# Patient Record
Sex: Female | Born: 1950 | Race: Black or African American | Hispanic: No | Marital: Married | Smoking: Former smoker
Health system: Southern US, Community
[De-identification: ages and names within clinical notes are randomized; demographics above are authoritative.]

## PROBLEM LIST (undated history)

## (undated) DIAGNOSIS — J449 Chronic obstructive pulmonary disease, unspecified: Secondary | ICD-10-CM

## (undated) DIAGNOSIS — I509 Heart failure, unspecified: Secondary | ICD-10-CM

## (undated) DIAGNOSIS — I10 Essential (primary) hypertension: Secondary | ICD-10-CM

## (undated) DIAGNOSIS — I2510 Atherosclerotic heart disease of native coronary artery without angina pectoris: Secondary | ICD-10-CM

## (undated) DIAGNOSIS — J45909 Unspecified asthma, uncomplicated: Secondary | ICD-10-CM | POA: Insufficient documentation

## (undated) HISTORY — PX: CARDIAC DEFIBRILLATOR PLACEMENT: SHX171

## (undated) HISTORY — DX: Heart failure, unspecified: I50.9

---

## 2009-07-06 DIAGNOSIS — G4733 Obstructive sleep apnea (adult) (pediatric): Secondary | ICD-10-CM | POA: Insufficient documentation

## 2009-07-06 DIAGNOSIS — F319 Bipolar disorder, unspecified: Secondary | ICD-10-CM | POA: Insufficient documentation

## 2009-07-06 DIAGNOSIS — K219 Gastro-esophageal reflux disease without esophagitis: Secondary | ICD-10-CM | POA: Insufficient documentation

## 2013-06-18 DIAGNOSIS — I509 Heart failure, unspecified: Secondary | ICD-10-CM | POA: Insufficient documentation

## 2013-06-18 DIAGNOSIS — I428 Other cardiomyopathies: Secondary | ICD-10-CM | POA: Insufficient documentation

## 2013-12-12 DIAGNOSIS — J449 Chronic obstructive pulmonary disease, unspecified: Secondary | ICD-10-CM | POA: Insufficient documentation

## 2015-03-25 DIAGNOSIS — F419 Anxiety disorder, unspecified: Secondary | ICD-10-CM | POA: Insufficient documentation

## 2015-03-25 DIAGNOSIS — I10 Essential (primary) hypertension: Secondary | ICD-10-CM | POA: Insufficient documentation

## 2015-03-25 DIAGNOSIS — F411 Generalized anxiety disorder: Secondary | ICD-10-CM | POA: Insufficient documentation

## 2015-03-25 DIAGNOSIS — R0609 Other forms of dyspnea: Secondary | ICD-10-CM

## 2015-04-05 ENCOUNTER — Institutional Professional Consult (permissible substitution): Payer: Medicare Other | Admitting: Internal Medicine

## 2015-11-17 DIAGNOSIS — E876 Hypokalemia: Secondary | ICD-10-CM | POA: Insufficient documentation

## 2016-02-17 ENCOUNTER — Ambulatory Visit
Admission: RE | Admit: 2016-02-17 | Discharge: 2016-02-17 | Disposition: A | Discharge status: Home / Self Care | Payer: Medicare HMO | Source: Ambulatory Visit | Attending: Family Medicine | Admitting: Family Medicine

## 2016-02-17 ENCOUNTER — Other Ambulatory Visit: Payer: Self-pay | Admitting: Family Medicine

## 2016-02-17 DIAGNOSIS — M545 Low back pain: Secondary | ICD-10-CM

## 2016-02-17 DIAGNOSIS — M5136 Other intervertebral disc degeneration, lumbar region: Secondary | ICD-10-CM | POA: Diagnosis not present

## 2016-02-17 DIAGNOSIS — M5137 Other intervertebral disc degeneration, lumbosacral region: Secondary | ICD-10-CM | POA: Insufficient documentation

## 2016-03-09 DIAGNOSIS — R109 Unspecified abdominal pain: Secondary | ICD-10-CM | POA: Insufficient documentation

## 2016-03-09 DIAGNOSIS — R1900 Intra-abdominal and pelvic swelling, mass and lump, unspecified site: Secondary | ICD-10-CM | POA: Insufficient documentation

## 2016-06-23 DIAGNOSIS — Z9889 Other specified postprocedural states: Secondary | ICD-10-CM | POA: Insufficient documentation

## 2016-06-23 DIAGNOSIS — Z95810 Presence of automatic (implantable) cardiac defibrillator: Secondary | ICD-10-CM | POA: Insufficient documentation

## 2017-05-30 ENCOUNTER — Ambulatory Visit
Admission: RE | Admit: 2017-05-30 | Discharge: 2017-05-30 | Disposition: A | Discharge status: Home / Self Care | Payer: Medicare HMO | Source: Ambulatory Visit | Attending: Family Medicine | Admitting: Family Medicine

## 2017-05-30 ENCOUNTER — Other Ambulatory Visit: Payer: Self-pay | Admitting: Family Medicine

## 2017-05-30 DIAGNOSIS — J449 Chronic obstructive pulmonary disease, unspecified: Secondary | ICD-10-CM

## 2017-05-30 DIAGNOSIS — I509 Heart failure, unspecified: Secondary | ICD-10-CM | POA: Diagnosis not present

## 2017-05-30 DIAGNOSIS — M79671 Pain in right foot: Secondary | ICD-10-CM

## 2017-07-18 ENCOUNTER — Encounter: Payer: Self-pay | Admitting: Emergency Medicine

## 2017-07-18 ENCOUNTER — Emergency Department: Payer: Medicare HMO

## 2017-07-18 ENCOUNTER — Emergency Department
Admission: EM | Admit: 2017-07-18 | Discharge: 2017-07-18 | Disposition: A | Discharge status: Home / Self Care | Payer: Medicare HMO | Attending: Emergency Medicine | Admitting: Emergency Medicine

## 2017-07-18 DIAGNOSIS — S92355A Nondisplaced fracture of fifth metatarsal bone, left foot, initial encounter for closed fracture: Secondary | ICD-10-CM | POA: Diagnosis not present

## 2017-07-18 DIAGNOSIS — Y929 Unspecified place or not applicable: Secondary | ICD-10-CM | POA: Insufficient documentation

## 2017-07-18 DIAGNOSIS — Z95810 Presence of automatic (implantable) cardiac defibrillator: Secondary | ICD-10-CM | POA: Diagnosis not present

## 2017-07-18 DIAGNOSIS — J449 Chronic obstructive pulmonary disease, unspecified: Secondary | ICD-10-CM | POA: Insufficient documentation

## 2017-07-18 DIAGNOSIS — Y9301 Activity, walking, marching and hiking: Secondary | ICD-10-CM | POA: Insufficient documentation

## 2017-07-18 DIAGNOSIS — Y999 Unspecified external cause status: Secondary | ICD-10-CM | POA: Diagnosis not present

## 2017-07-18 DIAGNOSIS — I10 Essential (primary) hypertension: Secondary | ICD-10-CM | POA: Insufficient documentation

## 2017-07-18 DIAGNOSIS — I2510 Atherosclerotic heart disease of native coronary artery without angina pectoris: Secondary | ICD-10-CM | POA: Insufficient documentation

## 2017-07-18 DIAGNOSIS — W19XXXA Unspecified fall, initial encounter: Secondary | ICD-10-CM | POA: Insufficient documentation

## 2017-07-18 DIAGNOSIS — S99922A Unspecified injury of left foot, initial encounter: Secondary | ICD-10-CM | POA: Diagnosis present

## 2017-07-18 HISTORY — DX: Chronic obstructive pulmonary disease, unspecified: J44.9

## 2017-07-18 HISTORY — DX: Atherosclerotic heart disease of native coronary artery without angina pectoris: I25.10

## 2017-07-18 HISTORY — DX: Essential (primary) hypertension: I10

## 2017-07-18 MED ORDER — HYDROCODONE-ACETAMINOPHEN 5-325 MG PO TABS: 1.0000 | ORAL_TABLET | Freq: Four times a day (QID) | ORAL | 0 refills | Status: DC | PRN

## 2017-07-18 NOTE — ED Triage Notes (Signed)
Pt to ED via POV with c/o LFT foot pain after mechanical fall this am. Pt denies any other injures. VS stable

## 2017-07-18 NOTE — Discharge Instructions (Signed)
Ice and elevation. Wear the wooden shoe until able to see a podiatrist. Follow-up with Dr. Ether Griffins. You will need to call and make an appointment. Take Vicodin as needed for pain every 6 hours. This medication could cause drowsiness.

## 2017-07-18 NOTE — ED Provider Notes (Signed)
[facility] Emergency Department Provider Note   ____________________________________________   First MD Initiated Contact with Patient 07/18/17 1229     (approximate)  I have reviewed the triage vital signs and the nursing notes.   HISTORY  Chief Complaint Foot Injury   HPI Penny Munoz is a 66 y.o. female she is complaining of left foot pain. Patient states that she had a mechanical fall this morning. She denies any head injury or loss of consciousness and was able to drive her husband to the cancer Center. She is continued to have increased pain this morning. She denies any previous problems with her foot.she rates her pain as a 10 over 10 and increases with walking.   Past Medical History:  Diagnosis Date  . COPD (chronic obstructive pulmonary disease) (HCC)   . Coronary artery disease   . Hypertension     There are no active problems to display for this patient.   Past Surgical History:  Procedure Laterality Date  . CARDIAC DEFIBRILLATOR PLACEMENT      Prior to Admission medications   Medication Sig Start Date End Date Taking? Authorizing Provider  HYDROcodone-acetaminophen (NORCO/VICODIN) 5-325 MG tablet Take 1 tablet by mouth every 6 (six) hours as needed. 07/18/17   Tommi Rumps, PA-C    Allergies Penicillins  No family history on file.  Social History Social History   Tobacco Use  . Smoking status: Never Smoker  . Smokeless tobacco: Never Used  Substance Use Topics  . Alcohol use: No    Frequency: Never  . Drug use: No    Review of Systems Constitutional: No fever/chills Cardiovascular: Denies chest pain. Respiratory: Denies shortness of breath. Musculoskeletal: positive left foot pain. Skin: Negative for rash. Neurological: Negative for focal weakness or numbness. ___________________________________________   PHYSICAL EXAM:  VITAL SIGNS: ED Triage Vitals  Enc Vitals Group     BP 07/18/17 1155  137/81     Pulse Rate 07/18/17 1155 70     Resp 07/18/17 1155 18     Temp 07/18/17 1155 98.3 F (36.8 C)     Temp Source 07/18/17 1155 Oral     SpO2 07/18/17 1155 99 %     Weight 07/18/17 1155 300 lb (136.1 kg)     Height 07/18/17 1155 5\' 8"  (1.727 m)     Head Circumference --      Peak Flow --      Pain Score 07/18/17 1154 10     Pain Loc --      Pain Edu? --      Excl. in GC? --    Constitutional: Alert and oriented. Well appearing and in no acute distress. Eyes: Conjunctivae are normal.  Head: Atraumatic. Neck: No stridor.   Cardiovascular: Normal rate, regular rhythm. Grossly normal heart sounds.  Good peripheral circulation. Respiratory: Normal respiratory effort.  No retractions. Lungs CTAB. Musculoskeletal: on examination of left that there is moderate tenderness on palpation of the lateral aspect metatarsal area. No gross deformity is noted. There is some soft tissue swelling present. Skin is intact. Neurologic:  Normal speech and language. No gross focal neurologic deficits are appreciated.  Skin:  Skin is warm, dry and intact. No rash noted. Psychiatric: Mood and affect are normal. Speech and behavior are normal.  ____________________________________________   LABS (all labs ordered are listed, but only abnormal results are displayed)  Labs Reviewed - No data to display  RADIOLOGY  Dg Foot Complete Left  Result Date:  07/18/2017 CLINICAL DATA:  Recent fall with lateral foot pain, initial encounter EXAM: LEFT FOOT - COMPLETE 3+ VIEW COMPARISON:  None. FINDINGS: Undisplaced fracture through the base of the fifth metatarsal is noted. No other fractures are seen. Mild soft tissue swelling is seen. IMPRESSION: Undisplaced fifth metatarsal fracture at the base. Electronically Signed   By: Alcide CleverMark  Lukens M.D.   On: 07/18/2017 12:25    ____________________________________________   PROCEDURES  Procedure(s) performed: None  Procedures  Critical Care performed:  No  ____________________________________________   INITIAL IMPRESSION / ASSESSMENT AND PLAN / ED COURSE  Patient was made aware that she has a fracture of her fifth metatarsal. She is to, compartment with Dr. Ether GriffinsFowler. At this time she was placed in an Ace wrap and postop shoe. She states that she has taken Vicodin in the past and did not have any difficulty taking it. She was warned that this medication could cause drowsiness and increase her risk for falling.  ____________________________________________   FINAL CLINICAL IMPRESSION(S) / ED DIAGNOSES  Final diagnoses:  Closed nondisplaced fracture of fifth metatarsal bone of left foot, initial encounter     ED Discharge Orders        Ordered    HYDROcodone-acetaminophen (NORCO/VICODIN) 5-325 MG tablet  Every 6 hours PRN     07/18/17 1251       Note:  This document was prepared using Dragon voice recognition software and may include unintentional dictation errors.    Tommi RumpsSummers, Cavan Bearden L, PA-C 07/18/17 1304    Sharman CheekStafford, Phillip, MD 07/18/17 [phone]

## 2018-01-16 ENCOUNTER — Ambulatory Visit: Payer: Self-pay | Admitting: Surgery

## 2018-04-18 ENCOUNTER — Encounter: Payer: Self-pay | Admitting: *Deleted

## 2018-06-13 ENCOUNTER — Other Ambulatory Visit: Payer: Self-pay | Admitting: Family Medicine

## 2018-06-13 DIAGNOSIS — Z1231 Encounter for screening mammogram for malignant neoplasm of breast: Secondary | ICD-10-CM

## 2018-07-15 ENCOUNTER — Other Ambulatory Visit: Payer: Self-pay | Admitting: Family Medicine

## 2018-07-15 DIAGNOSIS — Z13820 Encounter for screening for osteoporosis: Secondary | ICD-10-CM

## 2018-10-02 ENCOUNTER — Other Ambulatory Visit: Payer: Self-pay | Admitting: Family Medicine

## 2018-10-02 ENCOUNTER — Other Ambulatory Visit: Payer: Self-pay | Admitting: Internal Medicine

## 2018-10-02 DIAGNOSIS — Z1231 Encounter for screening mammogram for malignant neoplasm of breast: Secondary | ICD-10-CM

## 2018-12-31 ENCOUNTER — Encounter: Payer: Self-pay | Admitting: *Deleted

## 2019-01-29 ENCOUNTER — Ambulatory Visit (INDEPENDENT_AMBULATORY_CARE_PROVIDER_SITE_OTHER): Payer: Medicare HMO | Admitting: Gastroenterology

## 2019-01-29 ENCOUNTER — Encounter: Payer: Self-pay | Admitting: Family Medicine

## 2019-01-29 DIAGNOSIS — R195 Other fecal abnormalities: Secondary | ICD-10-CM

## 2019-01-29 NOTE — Progress Notes (Signed)
[facility] [address]  [address]  [place], [place] [address]  Main: [phone]  Fax: [phone]   Gastroenterology Consultation  Referring Provider:     Leanna Sato, MD Primary Care Physician:  Leanna Sato, MD Reason for Consultation:    Blood in stool        HPI:   Virtual Visit via Video Note  I connected with patient on 01/29/19 at  9:00 AM EDT by video ([email or web address]) and verified that I am speaking with the correct person using two identifiers.   I discussed the limitations, risks, security and privacy concerns of performing an evaluation and management service by video and the availability of in person appointments. I also discussed with the patient that there may be a patient responsible charge related to this service. The patient expressed understanding and agreed to proceed.  Location of the patient: Home Location of provider: Home Participating persons: Patient and provider only (Nursing staff checked in patient via phone but were not physically involved in the video interaction - see their notes)   History of Present Illness: Chief Complaint  Patient presents with  . New Patient (Initial Visit)  . Blood In Stools    Penny Munoz is a 68 y.o. y/o female referred for consultation & management  by Dr. Marvis Moeller, Doralee Albino, MD.  Patient visited her PCP due to blood in stool.  Patient states right before the episode occurred she had started eating cayenne pepper every day, along with ginger root and also started taking a fiber supplement which led to multiple loose bowel movements and noted small streaks of blood in the stool when this occurred.  She denies any black stool with these episodes.  After she stopped taking all the above supplements and her stool started to normalize, she did not see any further blood in stool.  Now her bowel movements are brown in color.  Reports previous history of hemorrhoids and has used Preparation H intermittently,  and did use it at the time that her bright red blood episode occurred in May 2020 and this resolved the symptoms.  No prior colonoscopy.  No family history of colon cancer.  PCP ordered FOBT test which was positive. PCP note specifically states patient is not a candidate for colonoscopy due to her cardiomyopathy.  Past Medical History:  Diagnosis Date  . COPD (chronic obstructive pulmonary disease) (HCC)   . Coronary artery disease   . Hypertension     Past Surgical History:  Procedure Laterality Date  . CARDIAC DEFIBRILLATOR PLACEMENT      Prior to Admission medications   Medication Sig Start Date End Date Taking? Authorizing Provider  acetaminophen (TYLENOL) 325 MG tablet Take by mouth. 03/09/16  Yes [provider]  albuterol (PROVENTIL HFA) 108 (90 Base) MCG/ACT inhaler Inhale into the lungs.   Yes [provider]  carvedilol (COREG) 12.5 MG tablet Take 12.5 mg by mouth 2 (two) times daily with a meal.   Yes [provider]  cetirizine (ZYRTEC) 10 MG tablet Take 10 mg by mouth daily.   Yes [provider]  furosemide (LASIX) 40 MG tablet Take 40 mg by mouth 2 (two) times daily.   Yes [provider]  Garlic (GNP GARLIC EXTRACT) 294 MG TABS KYOLIC AGED GARLIC EXTRACT 600MG  03/25/15  Yes [provider]  HYDROcodone-acetaminophen (NORCO/VICODIN) 5-325 MG tablet Take 1 tablet by mouth every 6 (six) hours as needed. 07/18/17  Yes Tommi Rumps,  PA-C  omeprazole (PRILOSEC) 20 MG capsule Take 20 mg by mouth daily.   Yes [provider]  Potassium Chloride CR (MICRO-K) 8 MEQ CPCR capsule CR Take 8 mEq by mouth daily.   Yes [provider]  quinapril (ACCUPRIL) 20 MG tablet Take 20 mg by mouth at bedtime.   Yes [provider]  Zinc 10 MG LOZG Take by mouth.   Yes [provider]    No family history on file.   Social History   Tobacco Use  . Smoking status: Never Smoker  . Smokeless  tobacco: Never Used  Substance Use Topics  . Alcohol use: No    Frequency: Never  . Drug use: No    Allergies as of 01/29/2019 - Review Complete 01/29/2019  Allergen Reaction Noted  . Hydrocodone Other (See Comments) 03/29/2016  . Oxycodone hcl Shortness Of Breath 03/17/2016  . Ampicillin  06/23/2014  . Amoxicillin Rash 11/03/2014  . Penicillins Anxiety 07/18/2017    Review of Systems:    All systems reviewed and negative except where noted in HPI.   Observations/Objective:  Labs: 2019 labs reviewed Imaging Studies: No results found.  Assessment and Plan:   Penny Munoz is a 68 y.o. y/o female has been referred for blood in stool  Assessment and Plan: The episode of blood in stool occurred in May 2020 when patient took fiber supplements, and multiple days of cayenne pepper with ginger root extract leading to loose bowel movements for days.  Symptoms most consistent with likely underlying hemorrhoids as this blood in stool resolved after her multiple loose bowel movements resolved and after she stopped taking the above fiber supplements.  She denies any black stool or melena  However, she has never had a colonoscopy and FOBT being positive is an indication for colonoscopy.   We extensively discussed this with the patient and described that a colonoscopy would be needed to rule out colon cancer and any other underlying lesions such as large polyps which can possibly be removed at this time.  I discussed that we would discuss the risks of the procedure with her cardiologist to see if they think it would be safe to proceed prior to scheduling the procedure.  However, patient refuses a full colonoscopy and understands the risk of underlying malignancy and states, "whatever god wishes for me".  We therefore discussed the alternatives of CT colonoscopy with the limitation that if it shows an underlying lesion she will need further evaluation with full colonoscopy and it  may miss small lesions.  She understands these risks and would rather proceed with a CT colonoscopy at this time.  I will also obtain a hemoglobin to ensure it is normal given her previous episodes of blood per rectum  I have asked her to avoid the fiber supplements that caused diarrhea  Follow Up Instructions: Follow-up in 4 to 6 weeks  I discussed the assessment and treatment plan with the patient. The patient was provided an opportunity to ask questions and all were answered. The patient agreed with the plan and demonstrated an understanding of the instructions.   The patient was advised to call back or seek an in-person evaluation if the symptoms worsen or if the condition fails to improve as anticipated.  I provided 30 minutes of face-to-face time via video software during this encounter.  Additional time was spent in reviewing patient's chart, placing orders etc.   Virgel Manifold, MD  Speech recognition software was used to  dictate the above note.

## 2019-01-30 ENCOUNTER — Other Ambulatory Visit: Payer: Self-pay

## 2019-01-30 DIAGNOSIS — R195 Other fecal abnormalities: Secondary | ICD-10-CM

## 2019-01-30 DIAGNOSIS — Z1211 Encounter for screening for malignant neoplasm of colon: Secondary | ICD-10-CM

## 2019-02-06 ENCOUNTER — Ambulatory Visit: Payer: Medicare HMO | Admitting: Gastroenterology

## 2019-03-06 ENCOUNTER — Ambulatory Visit: Payer: Medicare HMO | Admitting: Gastroenterology

## 2019-03-18 ENCOUNTER — Other Ambulatory Visit: Payer: Self-pay

## 2019-03-18 DIAGNOSIS — R195 Other fecal abnormalities: Secondary | ICD-10-CM

## 2019-04-01 ENCOUNTER — Telehealth: Payer: Self-pay | Admitting: Gastroenterology

## 2019-04-01 NOTE — Telephone Encounter (Signed)
Patient called stating we were trying to schedule her a scan in [facility] & she does not want to go there. She would like to have the scan at [facility]. Please call & l/m she does not answer but will return call.

## 2019-04-03 NOTE — Telephone Encounter (Signed)
Tiffany from [facility] [facility] is calling regarding pt CT scan she statets pt would like have it it scheduled at [facility] not [facility] any questions you can call  cb [phone]

## 2019-04-04 NOTE — Telephone Encounter (Signed)
Spoke with pt and advised her [facility] is the only location that offers a CT colonoscopy. Pt stated that is too far for her to drive. Her husband has had many strokes and she is scared to leave him. I advised her I would let you know her situation and discuss any other options available, if any.

## 2019-04-24 ENCOUNTER — Telehealth: Payer: Self-pay

## 2019-04-24 NOTE — Telephone Encounter (Signed)
Patient states she wants to scheduled her CT Virtual colonoscopy Diagnostic for 05/12/2019 because she has someone to stay with her husband that day.  Called the [facility] on [address]  There number is [phone] They state we need to fax the information to them and then they would contact the patient to set up the appointment. Faxed the order to them at [phone] and informed patient they would be contacting her to set up appointment.

## 2019-04-25 NOTE — Telephone Encounter (Signed)
Called to check if they received the order. They stated they did and they would do it for her on 05/12/2019 and would call her today to scheduled and go over the directions.

## 2019-04-25 NOTE — Telephone Encounter (Signed)
Patient is scheduled for 05/12/19

## 2019-05-12 ENCOUNTER — Ambulatory Visit
Admission: RE | Admit: 2019-05-12 | Discharge: 2019-05-12 | Disposition: A | Discharge status: Home / Self Care | Payer: Medicare HMO | Source: Ambulatory Visit | Attending: Gastroenterology | Admitting: Gastroenterology

## 2019-05-12 DIAGNOSIS — R195 Other fecal abnormalities: Secondary | ICD-10-CM

## 2019-05-15 ENCOUNTER — Telehealth: Payer: Self-pay

## 2019-05-15 NOTE — Telephone Encounter (Signed)
Patient is calling because patient states she can not wait till 06/04/19 when she has a appointment with Dr. Bonna Gains. Patient had a CT virtual colonoscopy done on 05/12/2019 and wants to know the results. Please advised

## 2019-05-15 NOTE — Telephone Encounter (Signed)
There was no evidence of polyp or cancer based on the CT scan results.  She has diverticulosis of the colon, predominantly in the sigmoid area  Scattered colonic diverticulosis. Moderate in the sigmoid colon. Areas of under distention in the sigmoid colon and right colon. No visible fixed polypoid filling defects or annular constricting Lesions.  Sherri Sear, MD

## 2019-05-15 NOTE — Telephone Encounter (Signed)
Patient verbalized understanding and will follow up on 06/04/19

## 2019-06-04 ENCOUNTER — Ambulatory Visit: Payer: Medicare HMO | Admitting: Gastroenterology

## 2019-06-04 ENCOUNTER — Encounter: Payer: Self-pay | Admitting: Gastroenterology

## 2020-06-14 ENCOUNTER — Encounter (INDEPENDENT_AMBULATORY_CARE_PROVIDER_SITE_OTHER): Payer: Self-pay

## 2020-06-14 ENCOUNTER — Inpatient Hospital Stay: Payer: Medicare HMO | Attending: Oncology | Admitting: Oncology

## 2020-06-14 ENCOUNTER — Inpatient Hospital Stay: Payer: Medicare HMO

## 2020-06-14 ENCOUNTER — Other Ambulatory Visit: Payer: Self-pay

## 2020-06-14 ENCOUNTER — Encounter: Payer: Self-pay | Admitting: Oncology

## 2020-06-14 VITALS — BP 116/85 | HR 72 | Temp 97.0°F | Resp 20 | Wt 281.8 lb

## 2020-06-14 DIAGNOSIS — I110 Hypertensive heart disease with heart failure: Secondary | ICD-10-CM | POA: Diagnosis not present

## 2020-06-14 DIAGNOSIS — H9202 Otalgia, left ear: Secondary | ICD-10-CM | POA: Diagnosis not present

## 2020-06-14 DIAGNOSIS — Z885 Allergy status to narcotic agent status: Secondary | ICD-10-CM | POA: Diagnosis not present

## 2020-06-14 DIAGNOSIS — I2510 Atherosclerotic heart disease of native coronary artery without angina pectoris: Secondary | ICD-10-CM | POA: Diagnosis not present

## 2020-06-14 DIAGNOSIS — R5383 Other fatigue: Secondary | ICD-10-CM

## 2020-06-14 DIAGNOSIS — R1900 Intra-abdominal and pelvic swelling, mass and lump, unspecified site: Secondary | ICD-10-CM

## 2020-06-14 DIAGNOSIS — I428 Other cardiomyopathies: Secondary | ICD-10-CM

## 2020-06-14 DIAGNOSIS — K219 Gastro-esophageal reflux disease without esophagitis: Secondary | ICD-10-CM | POA: Diagnosis not present

## 2020-06-14 DIAGNOSIS — Z79899 Other long term (current) drug therapy: Secondary | ICD-10-CM

## 2020-06-14 DIAGNOSIS — J449 Chronic obstructive pulmonary disease, unspecified: Secondary | ICD-10-CM

## 2020-06-14 DIAGNOSIS — I509 Heart failure, unspecified: Secondary | ICD-10-CM

## 2020-06-14 DIAGNOSIS — Z880 Allergy status to penicillin: Secondary | ICD-10-CM

## 2020-06-14 NOTE — Progress Notes (Signed)
Patient states that she sometimes have pain in stomach, ear, and shooting pain in head.

## 2020-06-17 NOTE — Progress Notes (Signed)
Hematology/Oncology Consult note [facility] [phone] [phone]  Patient Care Team: Leanna Sato, MD as PCP - General (Family Medicine)   Name of the patient: Penny Munoz  [id number]  09-Nov-1950    Reason for referral-history of retroperitoneal mass   Referring physician-Dr. Darreld Mclean  Date of visit: 06/17/20   History of presenting illness- patient is a 69 year old female with a past medical history significant for COPD CHF, nonischemic cardiomyopathy, GERD among other medical problems. She was seen by Dr. Selena Batten from [facility] back in 2017 when she was found to have a 14.6 x 12 x 7.3 cm mass in the right retroperitoneum that displaces the duodenum and pancreatic head anteromedially.  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  Initially the plan was biopsied but that was thought to be a low yield and she had multiple comorbidities and was not deemed to be a surgical candidate.  A repeat scan 3 months later did not reveal any change in the size of her mass and given the low probability of malignancy she was asked to follow-up on an as-needed basis.  Patient now wants to establish follow-up as she is concerned about ongoing pain in her right flank that radiates to the front.  She is concerned that the mass is growing and something needs to be done about it.  She is concerned about the way her nails look and her ongoing fatigue.  She has not had any unintentional weight loss.  She has been caring for her husband who has had multiple CVAs.  Patient reports ongoing left ear pain which has been going on for a couple of months.  Patient also states that she had a cardiologist out of town but she now wants to establish care in [place] and does not have cardiology care set up here.  She reports that she has AICD placement  ECOG  PS-2 Pain scale- 4   Review of systems- Review of Systems  Constitutional: Positive for malaise/fatigue. Negative for chills, fever and weight loss.  HENT: Negative for congestion, ear discharge and nosebleeds.   Eyes: Negative for blurred vision.  Respiratory: Negative for cough, hemoptysis, sputum production, shortness of breath and wheezing.   Cardiovascular: Negative for chest pain, palpitations, orthopnea and claudication.  Gastrointestinal: Negative for abdominal pain, blood in stool, constipation, diarrhea, heartburn, melena, nausea and vomiting.       Right flank pain  Genitourinary: Negative for dysuria, flank pain, frequency, hematuria and urgency.  Musculoskeletal: Negative for back pain, joint pain and myalgias.  Skin: Negative for rash.  Neurological: Negative for dizziness, tingling, focal weakness, seizures, weakness and headaches.  Endo/Heme/Allergies: Does not bruise/bleed easily.  Psychiatric/Behavioral: Negative for depression and suicidal ideas. The patient does not have insomnia.     Allergies  Allergen Reactions  . Hydrocodone Other (See Comments)    "took one pill can't tolerate" "took one pill can't tolerate"   . Oxycodone Hcl Shortness Of Breath  . Ampicillin     Other reaction(s): Angioedema Other reaction(s): Angioedema   . Amoxicillin Rash  . Penicillins Anxiety    Patient Active Problem List   Diagnosis Date Noted  . H/O cardiac catheterization 06/23/2016  . ICD (implantable cardioverter-defibrillator) in place 06/23/2016  . Abdominal pain 03/09/2016  . 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  . Hypokalemia 11/17/2015  . Anxiety 03/25/2015  . Dyspnea on  exertion 03/25/2015  . Hypertension 03/25/2015  . Chronic obstructive pulmonary disease (HCC) 12/12/2013  . CHF (congestive heart failure) (HCC) 06/18/2013  . Nonischemic cardiomyopathy (HCC) 06/18/2013  . Bipolar disorder (HCC) 07/06/2009  . GERD (gastroesophageal reflux disease) 07/06/2009  .  High cholesterol 07/06/2009  . Obesity 07/06/2009  . Obstructive sleep apnea 07/06/2009  . Tear of meniscus of knee 02/02/2009  . Cataracts, bilateral 10/31/2008  . Asthma 08/14/1898  . Overactive bladder 08/14/1898     Past Medical History:  Diagnosis Date  . COPD (chronic obstructive pulmonary disease) (HCC)   . Coronary artery disease   . Hypertension      Past Surgical History:  Procedure Laterality Date  . CARDIAC DEFIBRILLATOR PLACEMENT      Social History   Socioeconomic History  . Marital status: Married    Spouse name: Not on file  . Number of children: Not on file  . Years of education: Not on file  . Highest education level: Not on file  Occupational History  . Not on file  Tobacco Use  . Smoking status: Never Smoker  . Smokeless tobacco: Never Used  Substance and Sexual Activity  . Alcohol use: No  . Drug use: No  . Sexual activity: Not on file  Other Topics Concern  . Not on file  Social History Narrative  . Not on file   Social Determinants of Health   Financial Resource Strain:   . Difficulty of Paying Living Expenses: Not on file  Food Insecurity:   . Worried About Programme researcher, broadcasting/film/video in the Last Year: Not on file  . Ran Out of Food in the Last Year: Not on file  Transportation Needs:   . Lack of Transportation (Medical): Not on file  . Lack of Transportation (Non-Medical): Not on file  Physical Activity:   . Days of Exercise per Week: Not on file  . Minutes of Exercise per Session: Not on file  Stress:   . Feeling of Stress : Not on file  Social Connections:   . Frequency of Communication with Friends and Family: Not on file  . Frequency of Social Gatherings with Friends and Family: Not on file  . Attends Religious Services: Not on file  . Active Member of Clubs or Organizations: Not on file  . Attends Banker Meetings: Not on file  . Marital Status: Not on file  Intimate Partner Violence:   . Fear of Current or  Ex-Partner: Not on file  . Emotionally Abused: Not on file  . Physically Abused: Not on file  . Sexually Abused: Not on file     History reviewed. No pertinent family history.   Current Outpatient Medications:  .  acetaminophen (TYLENOL) 325 MG tablet, Take by mouth., Disp: , Rfl:  .  albuterol (PROVENTIL HFA) 108 (90 Base) MCG/ACT inhaler, Inhale into the lungs., Disp: , Rfl:  .  allopurinol (ZYLOPRIM) 300 MG tablet, Take 300 mg by mouth daily., Disp: , Rfl:  .  calcium-vitamin D (OSCAL WITH D) 500-200 MG-UNIT tablet, Take 1 tablet by mouth., Disp: , Rfl:  .  carvedilol (COREG) 12.5 MG tablet, Take 12.5 mg by mouth 2 (two) times daily with a meal., Disp: , Rfl:  .  citalopram (CELEXA) 10 MG/5ML suspension, Take by mouth daily., Disp: , Rfl:  .  furosemide (LASIX) 40 MG tablet, Take 40 mg by mouth 2 (two) times daily., Disp: , Rfl:  .  Garlic (GNP GARLIC EXTRACT) 256 MG TABS,  KYOLIC AGED GARLIC EXTRACT 600MG , Disp: , Rfl:  .  Multiple Vitamins-Minerals (MULTIVITAMIN WITH MINERALS) tablet, Take 1 tablet by mouth daily., Disp: , Rfl:  .  omeprazole (PRILOSEC) 20 MG capsule, Take 20 mg by mouth daily., Disp: , Rfl:  .  Potassium Chloride CR (MICRO-K) 8 MEQ CPCR capsule CR, Take 8 mEq by mouth daily., Disp: , Rfl:  .  quinapril (ACCUPRIL) 20 MG tablet, Take 20 mg by mouth at bedtime., Disp: , Rfl:  .  Zinc 10 MG LOZG, Take by mouth., Disp: , Rfl:  .  cetirizine (ZYRTEC) 10 MG tablet, Take 10 mg by mouth daily. (Patient not taking: Reported on 06/14/2020), Disp: , Rfl:  .  HYDROcodone-acetaminophen (NORCO/VICODIN) 5-325 MG tablet, Take 1 tablet by mouth every 6 (six) hours as needed. (Patient not taking: Reported on 06/14/2020), Disp: 15 tablet, Rfl: 0   Physical exam:  Vitals:   06/14/20 1336  BP: 116/85  Pulse: 72  Resp: 20  Temp: (!) 97 F (36.1 C)  SpO2: 96%  Weight: 281 lb 12.8 oz (127.8 kg)   Physical Exam Constitutional:      General: She is not in acute distress.     Appearance: She is obese.  Eyes:     Pupils: Pupils are equal, round, and reactive to light.  Cardiovascular:     Rate and Rhythm: Normal rate and regular rhythm.     Heart sounds: Normal heart sounds.  Pulmonary:     Effort: Pulmonary effort is normal.     Breath sounds: Normal breath sounds.  Abdominal:     General: Bowel sounds are normal. There is no distension.     Palpations: Abdomen is soft.     Tenderness: There is no abdominal tenderness.  Skin:    General: Skin is warm and dry.  Neurological:     Mental Status: She is alert and oriented to person, place, and time.        No flowsheet data found. No flowsheet data found.  No images are attached to the encounter.  No results found.  Assessment and plan- Patient is a 69 y.o. female with prior history of right retroperitoneal mass close to 12 cm detected back in 2017 which was found to be nearly all lipomatous at [facility] mild referred for continued management of the mass  Discussed with the patient that back in 2017 patient had a 13 cm right retroperitoneal mass which was mostly lipomatous tissue and back then biopsy or surgery was not deemed to be necessary.  We will obtain a repeat CT abdomen and pelvis with contrast at this time to see if the mass has changed in any way.  Patient is highly concerned that she has some ongoing malignancy and wants to get a PET scan.  Discussed with the patient that we cannot get a PET scan unless there is an established diagnosis of malignancy.  I will refer her to ENT for ongoing left ear pain.  She will need to speak to her primary care doctor about establishing cardiology follow-up.  I will see her after CT abdomen and pelvis results are back   Thank you for this kind referral and the opportunity to participate in the care of this  Patient   Visit Diagnosis 1. Retroperitoneal mass     Dr. 2018, MD, MPH [facility] at [facility] 06/17/2020

## 2020-06-23 ENCOUNTER — Ambulatory Visit
Admission: RE | Admit: 2020-06-23 | Discharge: 2020-06-23 | Disposition: A | Discharge status: Home / Self Care | Payer: Medicare HMO | Source: Ambulatory Visit | Attending: Oncology | Admitting: Oncology

## 2020-06-23 ENCOUNTER — Other Ambulatory Visit: Payer: Self-pay

## 2020-06-23 DIAGNOSIS — R1900 Intra-abdominal and pelvic swelling, mass and lump, unspecified site: Secondary | ICD-10-CM | POA: Insufficient documentation

## 2020-06-23 LAB — POCT I-STAT CREATININE: Creatinine, Ser: 0.9 mg/dL (ref 0.44–1.00)

## 2020-06-23 MED ORDER — IOHEXOL 300 MG/ML  SOLN
125.0000 mL | Freq: Once | INTRAMUSCULAR | Status: AC | PRN
  Administered 2020-06-23: 125 mL via INTRAVENOUS

## 2020-06-24 ENCOUNTER — Inpatient Hospital Stay (HOSPITAL_BASED_OUTPATIENT_CLINIC_OR_DEPARTMENT_OTHER): Payer: Medicare HMO | Admitting: Oncology

## 2020-06-24 DIAGNOSIS — R1900 Intra-abdominal and pelvic swelling, mass and lump, unspecified site: Secondary | ICD-10-CM

## 2020-06-24 DIAGNOSIS — H9202 Otalgia, left ear: Secondary | ICD-10-CM | POA: Diagnosis not present

## 2020-06-25 ENCOUNTER — Ambulatory Visit: Payer: Medicare HMO | Admitting: Cardiology

## 2020-06-27 NOTE — Progress Notes (Signed)
I connected with Penny Munoz on 06/27/20 at  2:00 PM EST by video enabled telemedicine visit and verified that I am speaking with the correct person using two identifiers.   I discussed the limitations, risks, security and privacy concerns of performing an evaluation and management service by telemedicine and the availability of in-person appointments. I also discussed with the patient that there may be a patient responsible charge related to this service. The patient expressed understanding and agreed to proceed.  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 She was seen by Dr. Selena Batten from [facility] back in 2017 when she was found to have a 14.6 x 12 x 7.3 cm mass in the right retroperitoneum that displaces the duodenum and pancreatic head anteromedially.  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  Initially the plan was biopsied but that was thought to be a low yield and she had multiple comorbidities and was not deemed to be a surgical candidate.  A repeat scan 3 months later did not reveal any change in the size of her mass and given the low probability of malignancy she was asked to follow-up on an as-needed basis.  Patient now wants to establish follow-up as she is concerned about ongoing pain in her right flank that radiates to the front.  She is concerned that the mass is growing and something needs to be done about it.  She is concerned about the way her nails look and her ongoing fatigue.  She has not had  any unintentional weight loss.  She has been caring for her husband who has had multiple CVAs.  Patient reports ongoing left ear pain which has been going on for a couple of months.  Patient also states that she had a cardiologist out of town but she now wants to establish care in [place] and does not have cardiology care set up here.  She reports that she has AICD placement   Interval history: no acute issues since last visit. Reports left ear pain. Has chronic fatigue   Review of Systems  Constitutional: Positive for malaise/fatigue. Negative for chills, fever and weight loss.  HENT: Negative for congestion, ear discharge and nosebleeds.   Eyes: Negative for blurred vision.  Respiratory: Negative for cough, hemoptysis, sputum production, shortness of breath and wheezing.   Cardiovascular: Negative for chest pain, palpitations, orthopnea and claudication.  Gastrointestinal: Negative for abdominal pain, blood in stool, constipation, diarrhea, heartburn, melena, nausea and vomiting.  Genitourinary: Negative for dysuria, flank pain, frequency, hematuria and urgency.       Right flank pain chronic  Musculoskeletal: Negative for back pain, joint pain and myalgias.  Skin: Negative for rash.  Neurological: Negative for dizziness, tingling, focal weakness, seizures, weakness and headaches.  Endo/Heme/Allergies: Does not bruise/bleed easily.  Psychiatric/Behavioral: Negative for depression and suicidal ideas. The patient does not have insomnia.     Allergies  Allergen Reactions  . Hydrocodone Other (See Comments)    "took one pill can't tolerate" "took one pill can't tolerate"   . Oxycodone Hcl Shortness  Of Breath  . Ampicillin     Other reaction(s): Angioedema Other reaction(s): Angioedema   . Amoxicillin Rash  . Penicillins Anxiety    Past Medical History:  Diagnosis Date  . COPD (chronic obstructive pulmonary disease) (HCC)   . Coronary artery disease   . Hypertension      Past Surgical History:  Procedure Laterality Date  . CARDIAC DEFIBRILLATOR PLACEMENT      Social History   Socioeconomic History  . Marital status: Married    Spouse name: Not on file  . Number of children: Not on file  . Years of education: Not on file  . Highest education level: Not on file  Occupational History  . Not on file  Tobacco Use  . Smoking status: Never Smoker  . Smokeless tobacco: Never Used  Substance and Sexual Activity  . Alcohol use: No  . Drug use: No  . Sexual activity: Not on file  Other Topics Concern  . Not on file  Social History Narrative  . Not on file   Social Determinants of Health   Financial Resource Strain:   . Difficulty of Paying Living Expenses: Not on file  Food Insecurity:   . Worried About Programme researcher, broadcasting/film/video in the Last Year: Not on file  . Ran Out of Food in the Last Year: Not on file  Transportation Needs:   . Lack of Transportation (Medical): Not on file  . Lack of Transportation (Non-Medical): Not on file  Physical Activity:   . Days of Exercise per Week: Not on file  . Minutes of Exercise per Session: Not on file  Stress:   . Feeling of Stress : Not on file  Social Connections:   . Frequency of Communication with Friends and Family: Not on file  . Frequency of Social Gatherings with Friends and Family: Not on file  . Attends Religious Services: Not on file  . Active Member of Clubs or Organizations: Not on file  . Attends Banker Meetings: Not on file  . Marital Status: Not on file  Intimate Partner Violence:   . Fear of Current or Ex-Partner: Not on file  . Emotionally Abused: Not on file  . Physically Abused: Not on file  . Sexually Abused: Not on file    No family history on file.   Current Outpatient Medications:  .  acetaminophen (TYLENOL) 325 MG tablet, Take by mouth., Disp: , Rfl:  .  albuterol (PROVENTIL HFA) 108 (90 Base) MCG/ACT inhaler, Inhale into the lungs., Disp: , Rfl:  .   allopurinol (ZYLOPRIM) 300 MG tablet, Take 300 mg by mouth daily., Disp: , Rfl:  .  calcium-vitamin D (OSCAL WITH D) 500-200 MG-UNIT tablet, Take 1 tablet by mouth., Disp: , Rfl:  .  carvedilol (COREG) 12.5 MG tablet, Take 12.5 mg by mouth 2 (two) times daily with a meal., Disp: , Rfl:  .  cetirizine (ZYRTEC) 10 MG tablet, Take 10 mg by mouth daily. (Patient not taking: Reported on 06/14/2020), Disp: , Rfl:  .  citalopram (CELEXA) 10 MG/5ML suspension, Take by mouth daily., Disp: , Rfl:  .  furosemide (LASIX) 40 MG tablet, Take 40 mg by mouth 2 (two) times daily., Disp: , Rfl:  .  Garlic (GNP GARLIC EXTRACT) 595 MG TABS, KYOLIC AGED GARLIC EXTRACT 600MG , Disp: , Rfl:  .  HYDROcodone-acetaminophen (NORCO/VICODIN) 5-325 MG tablet, Take 1 tablet by mouth every 6 (six) hours as needed. (Patient not taking: Reported on 06/14/2020), Disp: 15  tablet, Rfl: 0 .  Multiple Vitamins-Minerals (MULTIVITAMIN WITH MINERALS) tablet, Take 1 tablet by mouth daily., Disp: , Rfl:  .  omeprazole (PRILOSEC) 20 MG capsule, Take 20 mg by mouth daily., Disp: , Rfl:  .  Potassium Chloride CR (MICRO-K) 8 MEQ CPCR capsule CR, Take 8 mEq by mouth daily., Disp: , Rfl:  .  quinapril (ACCUPRIL) 20 MG tablet, Take 20 mg by mouth at bedtime., Disp: , Rfl:  .  Zinc 10 MG LOZG, Take by mouth., Disp: , Rfl:   CT Abdomen Pelvis W Contrast  Result Date: 06/23/2020 CLINICAL DATA:  Follow-up retroperitoneal mass, likely lipoma versus low-grade liposarcoma EXAM: CT ABDOMEN AND PELVIS WITH CONTRAST TECHNIQUE: Multidetector CT imaging of the abdomen and pelvis was performed using the standard protocol following bolus administration of intravenous contrast. CONTRAST:  OMNIPAQUE IOHEXOL 300 MG/ML  SOLN chest COMPARISON:  05/12/2019 FINDINGS: Lower chest: No acute abnormality.  Small hiatal hernia. Hepatobiliary: No solid liver abnormality is seen. No gallstones, gallbladder wall thickening, or biliary dilatation. Pancreas: Unremarkable. No  pancreatic ductal dilatation or surrounding inflammatory changes. Spleen: Normal in size without significant abnormality. Adrenals/Urinary Tract: Adrenal glands are unremarkable. Kidneys are normal, without renal calculi, solid lesion, or hydronephrosis. Bladder is unremarkable. Stomach/Bowel: Stomach is within normal limits. Appendix appears normal. No evidence of bowel wall thickening, distention, or inflammatory changes. Sigmoid diverticulosis. Vascular/Lymphatic: Aortic atherosclerosis. No enlarged abdominal or pelvic lymph nodes. Reproductive: No mass or other significant abnormality. Calcified uterine fibroids. Other: No abdominal wall hernia or abnormality. No abdominopelvic ascites. No significant interval change in a large right fatty mass in the right retroperitoneum, centered posterior to the duodenum and pancreatic head, displacing the structures anteriorly. This mass measures approximately 15.0 x 8.9 x cm. Musculoskeletal: No acute or significant osseous findings. IMPRESSION: 1. No significant interval change in a large right fatty mass in the right retroperitoneum, centered posterior to the duodenum and pancreatic head, displacing the structures anteriorly. This mass measures approximately 15.0 x 8.9 x cm. 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 2. Sigmoid diverticulosis without evidence of diverticulitis. 3. Small hiatal hernia. 4. Aortic Atherosclerosis (ICD10-I70.0). Electronically Signed   By: Lauralyn Primes M.D.   On: 06/23/2020 14:26    No images are attached to the encounter.   CMP Latest Ref Rng & Units 06/23/2020  Creatinine 0.44 - 1.00 mg/dL 6.78   No flowsheet data found.   Observation/objective:appears in no acute distress over video visit today.   Assessment and plan:Patient is a 69 y.o. female with prior history of right retroperitoneal mass close to 12 cm  detected back in 2017 which was found to be nearly all lipomatous at [facility] mild referred for continued management of the mass. She is here to discuss ct scan results.    Recent ct scan was compared to the ct colonography doen a year ago. Fatty mass in the right retroperitoneum has remained stable around 15 cm. We will get it compared to 2017 scan as well. Given no significant change in size, she would not benefit from any additional intervention at this time. I did offer her surgical opinion at [facility] or [facility] but she does not wish to proceed with it at this time  Left ear pain- chronic. Wishes to see ENT will refer.   Follow-up instructions:cbc with diff, cmp and ct abdomen pelvis with contrast in 1 year and see her tehreafter  I  discussed the assessment and treatment plan with the patient. The patient was provided an opportunity to ask questions and all were answered. The patient agreed with the plan and demonstrated an understanding of the instructions.   The patient was advised to call back or seek an in-person evaluation if the symptoms worsen or if the condition fails to improve as anticipated.  Visit Diagnosis: 1. Retroperitoneal mass   2. Ear pain, left     Dr. Owens Shark, MD, MPH [facility] at [facility] Tel- [phone] 06/27/2020 11:06 AM

## 2020-07-05 ENCOUNTER — Other Ambulatory Visit: Payer: Self-pay | Admitting: *Deleted

## 2020-07-05 ENCOUNTER — Ambulatory Visit
Admission: RE | Admit: 2020-07-05 | Discharge: 2020-07-05 | Disposition: A | Discharge status: Home / Self Care | Payer: Self-pay | Source: Ambulatory Visit | Attending: Oncology | Admitting: Oncology

## 2020-07-05 DIAGNOSIS — R1900 Intra-abdominal and pelvic swelling, mass and lump, unspecified site: Secondary | ICD-10-CM

## 2020-07-07 ENCOUNTER — Telehealth: Payer: Self-pay

## 2020-07-07 NOTE — Telephone Encounter (Signed)
Spoke with pt about ENT referral, I let her know that the office has tried to contact her for an appt. I gave her the office number, she stated she would call and set up her appt.

## 2020-07-15 ENCOUNTER — Other Ambulatory Visit: Payer: Self-pay

## 2020-07-15 ENCOUNTER — Ambulatory Visit (INDEPENDENT_AMBULATORY_CARE_PROVIDER_SITE_OTHER): Payer: Medicare HMO | Admitting: Cardiovascular Disease

## 2020-07-15 ENCOUNTER — Encounter: Payer: Self-pay | Admitting: Cardiovascular Disease

## 2020-07-15 VITALS — BP 140/80 | HR 81 | Ht 68.0 in | Wt 280.0 lb

## 2020-07-15 DIAGNOSIS — I5022 Chronic systolic (congestive) heart failure: Secondary | ICD-10-CM

## 2020-07-15 DIAGNOSIS — Z95810 Presence of automatic (implantable) cardiac defibrillator: Secondary | ICD-10-CM

## 2020-07-15 DIAGNOSIS — I10 Essential (primary) hypertension: Secondary | ICD-10-CM | POA: Diagnosis not present

## 2020-07-15 DIAGNOSIS — I493 Ventricular premature depolarization: Secondary | ICD-10-CM

## 2020-07-15 NOTE — Progress Notes (Signed)
Cardiology Office Note   Date:  07/15/2020   ID:  Penny, Munoz 26-Apr-1951, MRN [id number]  PCP:  Penny Sato, MD  Cardiologist:   Penny Bears, MD   Chief Complaint  Patient presents with  . New Patient (Initial Visit)    Referred by Dr. Darreld Munoz for management of CHF; Meds verbally reviewed with patient.      History of Present Illness: Penny Munoz is a 69 y.o. female who was referred by Dr. Marvis Munoz for evaluation and management of chronic systolic heart failure.  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 She reports being diagnosed with nonischemic cardiomyopathy in early 2000 when she was living in [place].  She had an ICD placement in 2004.  She moved to [place] about 5 years ago and was being followed by Dr. Darrold Junker but has not seen him since 2019 and is now establishing with [place].  As a matter fact, she did not keep up with her medical care since Covid started due to concerns about getting the virus.  She did receive 2 doses of the vaccines and is thinking about getting the booster.  She quit smoking at least 5 years ago.  She reports dyspnea with minimal exertion with no chest pain.  In addition, she has chronic bilateral leg edema and she takes furosemide twice daily.  She uses CPAP daily and denies orthopnea or PND.  Past Medical History:  Diagnosis Date  . CHF (congestive heart failure) (HCC)   . COPD (chronic obstructive pulmonary disease) (HCC)   . Coronary artery disease   . Hypertension     Past Surgical History:  Procedure Laterality Date  . CARDIAC DEFIBRILLATOR PLACEMENT       Current Outpatient  Medications  Medication Sig Dispense Refill  . acetaminophen (TYLENOL) 325 MG tablet Take by mouth as needed.     Marland Kitchen albuterol (PROVENTIL HFA) 108 (90 Base) MCG/ACT inhaler Inhale into the lungs as needed.     Marland Kitchen allopurinol (ZYLOPRIM) 300 MG tablet Take 300 mg by mouth daily.    . carvedilol (COREG) 12.5 MG tablet Take 12.5 mg by mouth 2 (two) times daily with a meal.    . citalopram (CELEXA) 10 MG/5ML suspension Take by mouth daily.    . furosemide (LASIX) 40 MG tablet Take 40 mg by mouth 2 (two) times daily.    . Garlic (GNP GARLIC EXTRACT) 696 MG TABS KYOLIC AGED GARLIC EXTRACT 600MG     . HYDROcodone-acetaminophen (NORCO/VICODIN) 5-325 MG tablet Take 1 tablet by mouth every 6 (six) hours as needed. 15 tablet 0  . Multiple Minerals (CALCIUM/MAGNESIUM/ZINC PO) Take by mouth. Takes 1 tablet every 3 days    . Multiple Vitamins-Minerals (MULTIVITAMIN WITH MINERALS) tablet Take 1 tablet by mouth daily.    omeprazole (PRILOSEC) 20 MG capsule Take 20 mg by mouth daily.    . Potassium Chloride CR (MICRO-K) 8 MEQ CPCR capsule CR Take 8 mEq by mouth daily.    . quinapril (ACCUPRIL) 20 MG tablet Take 20 mg by mouth at bedtime.     No current facility-administered medications for this visit.    Allergies:   Hydrocodone, Oxycodone hcl, Ampicillin, Amoxicillin, and Penicillins    Social History:  The patient  reports that she has never smoked. She has never used smokeless tobacco. She reports that she does not drink alcohol and does not use drugs.   Family History:  The patient's family history is not on file. She was adopted.  She does not know her family history   ROS:  Please see the history of present illness.   Otherwise, review of systems are positive for none.   All other systems are reviewed and negative.    PHYSICAL EXAM: VS:  BP 140/80 (BP Location: Left Arm, Patient Position: Sitting, Cuff Size: Large)   Pulse 81   Ht 5\' 8"  (1.727 m)   Wt 280 lb (127 kg)   SpO2 94%   BMI 42.57  kg/m  , BMI Body mass index is 42.57 kg/m. GEN: Well nourished, well developed, in no acute distress  HEENT: normal  Neck: no JVD, carotid bruits, or masses Cardiac: RRR with premature beats; no murmurs, rubs, or gallops, mild bilateral leg edema Respiratory:  clear to auscultation bilaterally, normal work of breathing GI: soft, nontender, nondistended, + BS MS: no deformity or atrophy  Skin: warm and dry, no rash Neuro:  Strength and sensation are intact Psych: euthymic mood, full affect   EKG:  EKG is ordered today. The ekg ordered today demonstrates sinus rhythm with PVCs, right bundle branch block and left anterior fascicular block   Recent Labs: 06/23/2020: Creatinine, Ser 0.90    Lipid Panel No results found for: CHOL, TRIG, HDL, CHOLHDL, VLDL, LDLCALC, LDLDIRECT    Wt Readings from Last 3 Encounters:  07/15/20 280 lb (127 kg)  06/14/20 281 lb 12.8 oz (127.8 kg)  07/18/17 300 lb (136.1 kg)        PAD Screen 07/15/2020  Previous PAD dx? No  Previous surgical procedure? No  Pain with walking? No  Feet/toe relief with dangling? No  Painful, non-healing ulcers? No  Extremities discolored? No      ASSESSMENT AND PLAN:  1.  Chronic systolic heart failure: Currently [place] heart association class III.  She has prolonged history of nonischemic cardiomyopathy since early 2000.  No recent evaluation of ejection fraction.  I requested an echocardiogram.  If ejection fraction is low as expected, we need to adjust her heart failure medications including switching quinapril to Entresto, adding spironolactone likely trying to decrease the dose of furosemide.  In addition, she benefits from an SGLT2 inhibitor.  2.  Status post ICD placement: This has not been interrogated recently.  I am referring her to our EP department to follow on her device.  3.  Essential hypertension: Blood pressure is controlled on current medications.  4.  PVCs: Noted on EKG but she denies  palpitations.  If this persist, we could consider quantifying with a ZIO monitor.  5.  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  This is followed by oncology.    Disposition:   FU with me in 6 weeks  Signed,  2018, MD  07/15/2020 9:05 AM    [facility]

## 2020-07-15 NOTE — Patient Instructions (Signed)
Medication Instructions:  No medication changes  *If you need a refill on your cardiac medications before your next appointment, please call your pharmacy*   Lab Work: None If you have labs (blood work) drawn today and your tests are completely normal, you will receive your results only by: Marland Kitchen MyChart Message (if you have MyChart) OR . A paper copy in the mail If you have any lab test that is abnormal or we need to change your treatment, we will call you to review the results.   Testing/Procedures: Your physician has requested that you have an echocardiogram. Echocardiography is a painless test that uses sound waves to create images of your heart. It provides your doctor with information about the size and shape of your heart and how well your heart's chambers and valves are working. This procedure takes approximately one hour. There are no restrictions for this procedure.  There is a possibility that an IV may need to be started during your test to inject an image enhancing agent. This is done to obtain more optimal pictures of your heart. Therefore we ask that you do at least drink some water prior to coming in to hydrate your veins.     Follow-Up: At [facility], you and your health needs are our priority.  As part of our continuing mission to provide you with exceptional heart care, we have created designated Provider Care Teams.  These Care Teams include your primary Cardiologist (physician) and Advanced Practice Providers (APPs -  Physician Assistants and Nurse Practitioners) who all work together to provide you with the care you need, when you need it.  We recommend signing up for the patient portal called "MyChart".  Sign up information is provided on this After Visit Summary.  MyChart is used to connect with patients for Virtual Visits (Telemedicine).  Patients are able to view lab/test results, encounter notes, upcoming appointments, etc.  Non-urgent messages can be sent to your  provider as well.   To learn more about what you can do with MyChart, go to [email or web address].    Your next appointment:   6 week(s)  The format for your next appointment:   In Person  Provider:   You may see Dr. Kirke Corin or one of the following Advanced Practice Providers on your designated Care Team:    Nicolasa Ducking, NP  Eula Listen, PA-C  Marisue Ivan, PA-C  Cadence [place], [place]  Gillian Shields, NP    Other Instructions You have been referred to EP for ICD management

## 2020-08-05 ENCOUNTER — Ambulatory Visit (INDEPENDENT_AMBULATORY_CARE_PROVIDER_SITE_OTHER): Payer: Medicare HMO

## 2020-08-05 ENCOUNTER — Other Ambulatory Visit: Payer: Self-pay

## 2020-08-05 DIAGNOSIS — I5022 Chronic systolic (congestive) heart failure: Secondary | ICD-10-CM

## 2020-08-05 LAB — ECHOCARDIOGRAM COMPLETE
AR max vel: 2.41 cm2
AV Area VTI: 2.51 cm2
AV Area mean vel: 2.4 cm2
AV Mean grad: 5.3 mmHg
AV Peak grad: 9.2 mmHg
Ao pk vel: 1.52 m/s
Area-P 1/2: 2.31 cm2
Calc EF: 39.1 %
S' Lateral: 5.3 cm
Single Plane A2C EF: 39.9 %
Single Plane A4C EF: 37.6 %

## 2020-08-09 ENCOUNTER — Other Ambulatory Visit: Payer: Self-pay

## 2020-08-09 NOTE — Telephone Encounter (Signed)
Called and Left a VM with daughter per DPR on file, as I was unable to leave a message with patient. Requested that she call back our office so I could give her her patients echo result and instructions.

## 2020-08-09 NOTE — Telephone Encounter (Signed)
-----   Message from Iran Ouch, MD sent at 08/09/2020  4:36 AM EST ----- Inform patient that echo showed moderately reduced systolic function with an EF of 35 to 40%.  We need to switch her from quinapril to Entresto 24/26 mg twice daily after 36 hours of stopping quinapril.  Check basic metabolic profile when she comes for follow-up on January 13.

## 2020-08-10 ENCOUNTER — Telehealth: Payer: Self-pay

## 2020-08-10 MED ORDER — ENTRESTO 24-26 MG PO TABS: 1.0000 | ORAL_TABLET | Freq: Two times a day (BID) | ORAL | 5 refills | Status: DC

## 2020-08-10 NOTE — Telephone Encounter (Signed)
Spoke with patients daughter and reviewed all instructions from result note. She will inform her Mother of everything and remind her of her scheduled follow up with Dr. Lalla Brothers 08/25/20, and Gillian Shields on 08/26/20.

## 2020-08-10 NOTE — Telephone Encounter (Signed)
Spoke with patients daughter per DPR on file and relayed result note. She stated that she would inform her mother of instructions and remind her of her follow up with Dr. Lalla Brothers on 08/26/19, and Gillian Shields on 08/27/19.

## 2020-08-10 NOTE — Telephone Encounter (Addendum)
Patient called back and stated that her copay for the Sherryll Burger is $40 and she does not want to pay that until she knows she doesn't have a reaction to it. She was informed that a sample supply for 2 weeks has been left at the fron desk for her to pick up.  Medication Samples have been provided to the patient.  Drug name: Sherryll Burger       Strength: 24/24 MG        Qty: 1 bottle  LOT: [id number]  Exp.Date: 12/23  Dosing instructions: take 1 tab by mouth 2X a day.  The patient has been instructed regarding the correct time, dose, and frequency of taking this medication, including desired effects and most common side effects.

## 2020-08-20 ENCOUNTER — Telehealth: Payer: Self-pay | Admitting: Family

## 2020-08-20 NOTE — Telephone Encounter (Signed)
  Patient Consent for Virtual Visit         Penny Munoz has provided verbal consent on 08/20/2020 for a virtual visit (video or telephone).   CONSENT FOR VIRTUAL VISIT FOR:  Penny Munoz  By participating in this virtual visit I agree to the following:  I hereby voluntarily request, consent and authorize [facility] and its employed or contracted physicians, physician assistants, nurse practitioners or other licensed health care professionals (the Practitioner), to provide me with telemedicine health care services (the "Services") as deemed necessary by the treating Practitioner. I acknowledge and consent to receive the Services by the Practitioner via telemedicine. I understand that the telemedicine visit will involve communicating with the Practitioner through live audiovisual communication technology and the disclosure of certain medical information by electronic transmission. I acknowledge that I have been given the opportunity to request an in-person assessment or other available alternative prior to the telemedicine visit and am voluntarily participating in the telemedicine visit.  I understand that I have the right to withhold or withdraw my consent to the use of telemedicine in the course of my care at any time, without affecting my right to future care or treatment, and that the Practitioner or I may terminate the telemedicine visit at any time. I understand that I have the right to inspect all information obtained and/or recorded in the course of the telemedicine visit and may receive copies of available information for a reasonable fee.  I understand that some of the potential risks of receiving the Services via telemedicine include:  Marland Kitchen Delay or interruption in medical evaluation due to technological equipment failure or disruption; . Information transmitted may not be sufficient (e.g. poor resolution of images) to allow for appropriate medical decision making by the  Practitioner; and/or  . In rare instances, security protocols could fail, causing a breach of personal health information.  Furthermore, I acknowledge that it is my responsibility to provide information about my medical history, conditions and care that is complete and accurate to the best of my ability. I acknowledge that Practitioner's advice, recommendations, and/or decision may be based on factors not within their control, such as incomplete or inaccurate data provided by me or distortions of diagnostic images or specimens that may result from electronic transmissions. I understand that the practice of medicine is not an exact science and that Practitioner makes no warranties or guarantees regarding treatment outcomes. I acknowledge that a copy of this consent can be made available to me via my patient portal [facility] MyChart), or I can request a printed copy by calling the office of [facility].    I understand that my insurance will be billed for this visit.   I have read or had this consent read to me. . I understand the contents of this consent, which adequately explains the benefits and risks of the Services being provided via telemedicine.  . I have been provided ample opportunity to ask questions regarding this consent and the Services and have had my questions answered to my satisfaction. . I give my informed consent for the services to be provided through the use of telemedicine in my medical care

## 2020-08-25 ENCOUNTER — Institutional Professional Consult (permissible substitution): Payer: Medicare HMO | Admitting: Cardiology

## 2020-08-26 ENCOUNTER — Other Ambulatory Visit: Payer: Self-pay

## 2020-08-26 ENCOUNTER — Telehealth (INDEPENDENT_AMBULATORY_CARE_PROVIDER_SITE_OTHER): Payer: Medicare HMO | Admitting: Cardiovascular Disease

## 2020-08-26 ENCOUNTER — Telehealth: Payer: Medicare HMO | Admitting: Family

## 2020-08-26 ENCOUNTER — Telehealth: Payer: Self-pay | Admitting: Cardiovascular Disease

## 2020-08-26 ENCOUNTER — Telehealth: Payer: Self-pay

## 2020-08-26 ENCOUNTER — Encounter: Payer: Self-pay | Admitting: Cardiovascular Disease

## 2020-08-26 VITALS — BP 133/86 | HR 54 | Ht 68.0 in | Wt 280.0 lb

## 2020-08-26 DIAGNOSIS — I5022 Chronic systolic (congestive) heart failure: Secondary | ICD-10-CM | POA: Diagnosis not present

## 2020-08-26 DIAGNOSIS — I493 Ventricular premature depolarization: Secondary | ICD-10-CM

## 2020-08-26 DIAGNOSIS — I10 Essential (primary) hypertension: Secondary | ICD-10-CM | POA: Diagnosis not present

## 2020-08-26 DIAGNOSIS — Z95810 Presence of automatic (implantable) cardiac defibrillator: Secondary | ICD-10-CM

## 2020-08-26 MED ORDER — SPIRONOLACTONE 25 MG PO TABS: 25.0000 mg | ORAL_TABLET | Freq: Every day | ORAL | 5 refills | Status: DC

## 2020-08-26 MED ORDER — ENTRESTO 24-26 MG PO TABS: 1.0000 | ORAL_TABLET | Freq: Two times a day (BID) | ORAL | 3 refills | Status: DC

## 2020-08-26 MED ORDER — FUROSEMIDE 40 MG PO TABS: 40.0000 mg | ORAL_TABLET | Freq: Every day | ORAL | 5 refills | Status: DC

## 2020-08-26 NOTE — Telephone Encounter (Signed)
*  STAT* If patient is at the pharmacy, call can be transferred to refill team.   1. Which medications need to be refilled? (please list name of each medication and dose if known) Entresto 24-26 mg bid  2. Which pharmacy/location (including street and city if local pharmacy) is medication to be sent to?  [facility] [phone]  3. Do they need a 30 day or 90 day supply? 30 day with refills   Patient never picked up Rx from [facility]  Patient wants all future medications sent to

## 2020-08-26 NOTE — Telephone Encounter (Addendum)
Contacted the patient to review Dr. Jari Sportsman instructions from todays telephone visit.  Reviewed the patients medication changes  STOP Potassium REDUCE Lasix to 40 mg daily START Spironolactone 25 mg daily  Follow up appt in 3 months.  B[facility]. Pt sts that she does not leave her house due to COVID and she has transportation concerns.  Pt sts that she will try to get the lab done as requested.(she sounds reluctant) Adv the patient of the need for the lab and the importance. She verbalized understanding.  Samples of Entresto 24/26 mg left at the front desk to be picked up. Printed copy of the pt AVS placed in the bag as well. Patient has been made aware of this.  Patient has no other questions or concerns at this time.

## 2020-08-26 NOTE — Telephone Encounter (Signed)
Requested Prescriptions   Signed Prescriptions Disp Refills  . sacubitril-valsartan (ENTRESTO) 24-26 MG 60 tablet 3    Sig: Take 1 tablet by mouth 2 (two) times daily.    Authorizing Provider: ARIDA, MUHAMMAD A    Ordering User: Tressia Labrum C    

## 2020-08-26 NOTE — Progress Notes (Signed)
Virtual Visit via Telephone Note   This visit type was conducted due to national recommendations for restrictions regarding the COVID-19 Pandemic (e.g. social distancing) in an effort to limit this patient's exposure and mitigate transmission in our community.  Due to her co-morbid illnesses, this patient is at least at moderate risk for complications without adequate follow up.  This format is felt to be most appropriate for this patient at this time.  The patient did not have access to video technology/had technical difficulties with video requiring transitioning to audio format only (telephone).  All issues noted in this document were discussed and addressed.  No physical exam could be performed with this format.  Please refer to the patient's chart for her  consent to telehealth for [facility].    Date:  08/26/2020   ID:  Penny Munoz, DOB 1950/08/18, MRN [id number] The patient was identified using 2 identifiers.  Patient Location: Home Provider Location: Office/Clinic  PCP:  Leanna Sato, MD  Cardiologist:  No primary care provider on file.  Electrophysiologist:  None    Evaluation Performed:  Follow-Up Visit  Chief Complaint: Shortness of breath.  History of Present Illness:    Penny Munoz is a 70 y.o. female who was reached via phone for follow-up visit regarding chronic systolic heart failure.  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 She moved to [place] about around 2016. She quit smoking at least 5 years ago.   She recently established with me.  An echocardiogram was done which showed an EF of 35 to 40% with global  hypokinesis, mild to moderate mitral regurgitation, mild aortic regurgitation and grade 1 diastolic dysfunction. I switched her from enalapril to [facility].  She has been doing reasonably well with no chest pain.  She reports stable exertional dyspnea.     The patient does not have symptoms concerning for COVID-19 infection (fever, chills, cough, or new shortness of breath).    Past Medical History:  Diagnosis Date  . CHF (congestive heart failure) (HCC)   . COPD (chronic obstructive pulmonary disease) (HCC)   . Coronary artery disease   . Hypertension    Past Surgical History:  Procedure Laterality Date  . CARDIAC DEFIBRILLATOR PLACEMENT       Current Meds  Medication Sig  . acetaminophen (TYLENOL) 325 MG tablet Take by mouth as needed.   Marland Kitchen albuterol (VENTOLIN HFA) 108 (90 Base) MCG/ACT inhaler Inhale into the lungs as needed.   Marland Kitchen allopurinol (ZYLOPRIM) 300 MG tablet Take 300 mg by mouth daily.  . carvedilol (COREG) 12.5 MG tablet Take 12.5 mg by mouth 2 (two) times daily with a meal.  . citalopram (CELEXA) 10 MG/5ML suspension Take by mouth daily.  . furosemide (LASIX) 40 MG tablet Take 40 mg by mouth 2 (two) times daily.  . Garlic 400 MG TABS KYOLIC AGED GARLIC EXTRACT 600MG   . HYDROcodone-acetaminophen (NORCO/VICODIN) 5-325 MG tablet Take 1 tablet by mouth every 6 (six) hours as needed.  . Multiple Minerals (CALCIUM/MAGNESIUM/ZINC PO) Take by mouth. Takes 1 tablet every 3 days  . Multiple Vitamins-Minerals (MULTIVITAMIN WITH MINERALS) tablet Take 1 tablet by mouth daily.  omeprazole (PRILOSEC) 20 MG capsule Take 20 mg by mouth daily.  Marland Kitchen  Potassium Chloride CR (MICRO-K) 8 MEQ CPCR capsule CR Take 8 mEq by mouth daily.  . sacubitril-valsartan (ENTRESTO) 24-26 MG Take 1 tablet by mouth 2 (two) times daily.     Allergies:   Hydrocodone, Oxycodone hcl, Ampicillin, Amoxicillin, and Penicillins   Social History   Tobacco Use  . Smoking status: Never Smoker  . Smokeless tobacco:  Never Used  Vaping Use  . Vaping Use: Never used  Substance Use Topics  . Alcohol use: No  . Drug use: No     Family Hx: The patient's family history is not on file. She was adopted.  ROS:   Please see the history of present illness.     All other systems reviewed and are negative.   Prior CV studies:   The following studies were reviewed today:  Reviewed the results of echocardiogram with her.  Labs/Other Tests and Data Reviewed:    EKG:  No ECG reviewed.  Recent Labs: 06/23/2020: Creatinine, Ser 0.90   Recent Lipid Panel No results found for: CHOL, TRIG, HDL, CHOLHDL, LDLCALC, LDLDIRECT  Wt Readings from Last 3 Encounters:  08/26/20 280 lb (127 kg)  07/15/20 280 lb (127 kg)  06/14/20 281 lb 12.8 oz (127.8 kg)     Risk Assessment/Calculations:      Objective:    Vital Signs:  BP 133/86   Pulse (!) 54   Ht 5\' 8"  (1.727 m)   Wt 280 lb (127 kg)   BMI 42.57 kg/m    VITAL SIGNS:  reviewed  ASSESSMENT & PLAN:    1.  Chronic systolic heart failure: Currently [place] heart association class III.  She has prolonged history of nonischemic cardiomyopathy since early 2000.  Most recent ejection fraction was 35 to 40%.  Continue carvedilol.  2001 was recently added.  I elected to add spironolactone 25 mg once daily and stop potassium chloride.  In addition, elected to decrease furosemide to 40 mg once daily.  Check basic metabolic profile in 1 week.   2.  Status post ICD placement: The patient will establish with Dr. Sherryll Burger in the near future.  3.  Essential hypertension: Blood pressure is controlled on current medications.  4.  PVCs: Noted on EKG but she denies palpitations.  If this persist, we could consider quantifying with a ZIO monitor.  5.  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  This is followed by oncology.      Time:   Today, I have spent 8 minutes with the patient with telehealth technology discussing the above  problems.     Medication Adjustments/Labs and Tests Ordered: Current medicines are reviewed at length with the patient today.  Concerns regarding medicines are outlined above.   Tests Ordered: No orders of the defined types were placed in this encounter.   Medication Changes: No orders of the defined types were placed in this encounter.   Follow Up:  In Person in 3 month(s)  Signed, 2018, MD  08/26/2020 2:11 PM    [facility]

## 2020-08-26 NOTE — Patient Instructions (Signed)
Medication Instructions:  Your physician has recommended you make the following change in your medication:   1) STOP Potassium  2) REDUCE Lasix (furosemide) to 40 mg daily  3) START Spironolactone 25 mg daily. An Rx has been sent to your pharmacy  *If you need a refill on your cardiac medications before your next appointment, please call your pharmacy*   Lab Work: Your physician recommends that you return for lab work (bmet) in: 1 week  Please have your lab drawn at the [facility] mall. You do not need an appointment. Lab hours are Mon-Fri 7am-6pm  If you have labs (blood work) drawn today and your tests are completely normal, you will receive your results only by: Marland Kitchen MyChart Message (if you have MyChart) OR . A paper copy in the mail If you have any lab test that is abnormal or we need to change your treatment, we will call you to review the results.   Testing/Procedures: None ordered   Follow-Up: At [facility], you and your health needs are our priority.  As part of our continuing mission to provide you with exceptional heart care, we have created designated Provider Care Teams.  These Care Teams include your primary Cardiologist (physician) and Advanced Practice Providers (APPs -  Physician Assistants and Nurse Practitioners) who all work together to provide you with the care you need, when you need it.  We recommend signing up for the patient portal called "MyChart".  Sign up information is provided on this After Visit Summary.  MyChart is used to connect with patients for Virtual Visits (Telemedicine).  Patients are able to view lab/test results, encounter notes, upcoming appointments, etc.  Non-urgent messages can be sent to your provider as well.   To learn more about what you can do with MyChart, go to [email or web address].    Your next appointment:   3 month(s)  The format for your next appointment:   In Person  Provider:   You may see Lorine Bears, MD or  one of the following Advanced Practice Providers on your designated Care Team:    Nicolasa Ducking, NP  Eula Listen, PA-C  Marisue Ivan, PA-C  Cadence Pink, [place]  Gillian Shields, NP    Other Instructions N/A

## 2020-08-31 ENCOUNTER — Telehealth: Payer: Self-pay | Admitting: Cardiovascular Disease

## 2020-08-31 NOTE — Telephone Encounter (Signed)
Pt c/o medication issue:  1. Name of Medication: new med from virtual visit ? spironolactone?  2. How are you currently taking this medication (dosage and times per day)? Not started   3. Are you having a reaction (difficulty breathing--STAT)? no  4. What is your medication issue? Spoke with patient to schedule fu visit and she reports she is having trouble filling medication.  She was told it would cost $300 and she cannot afford this .  Patient declined to schedule now. She agreed to call back when she is able to get her medication and if she needs further assistance if unable to afford.

## 2020-08-31 NOTE — Telephone Encounter (Signed)
Attempted to schedule fu visit.  Patient declined to schedule at this time and states she will call back.      Per avs 3 m fu per checkout 08-26-20

## 2020-09-01 NOTE — Telephone Encounter (Signed)
Spironolactone was escribed to [place] pharmacy on 08/26/20. Called the pharmacy to inquire on what would be the patients out of pocket cost for the medication. lmtcb.

## 2020-09-02 NOTE — Telephone Encounter (Signed)
Called the patient. lmtcb. 

## 2020-09-03 NOTE — Telephone Encounter (Signed)
Scott from [employer]  States that patient picked up spironolactone 01/19 for $11 States that if the prescription was set for 90 days it would probably be less  Call with any additional questions

## 2020-09-03 NOTE — Telephone Encounter (Signed)
Spoke with [employer] from [facility]. Patient came to pick up medications and was not able to pick up the [facility]. She did call back regarding this elevated cost but mentioned Spironolactone verses the Entresto. Reviewed with pharmacy and advised I would call her to review this information with her. He was appreciative for the call with no further questions at this time.

## 2020-09-03 NOTE — Telephone Encounter (Signed)
Patient did not pick up her Entresto medication due to it costing almost $300.00. Advised that I can complete application for assistance which may allow her to get help with her medication. Application has been completed and pending her signature. Provider portion has also been done. Requested that she please come in to sign forms and I will then fax to see if we can get some assistance. She verbalized understanding with no further questions at this time.

## 2020-09-03 NOTE — Telephone Encounter (Signed)
Scott from [facility] calling back States that he thinks there may be a confusion with which medication she was unable to afford  Patient was prescribed Sherryll Burger which is $292 that the patient did not pick up from pharmacy  Please call to discuss

## 2020-09-06 NOTE — Telephone Encounter (Signed)
Patient dropped off signed form at front desk Placed in nurse box

## 2020-09-06 NOTE — Telephone Encounter (Signed)
Patient would like to discuss her PA for [facility]

## 2020-09-07 NOTE — Telephone Encounter (Addendum)
Patient was to have a BMP drawn one week (wk of 09/02/20) after starting spironolactone. This has not been completed. Lab reminder letter mailed to the patient.

## 2020-09-09 ENCOUNTER — Telehealth: Payer: Self-pay | Admitting: Cardiovascular Disease

## 2020-09-09 NOTE — Telephone Encounter (Signed)
New message    Patient calling the office for samples of medication:   1.  What medication and dosage are you requesting samples for? Entresto   2.  Are you currently out of this medication?  Yes   And she hasnt heard back to see if she was approved for financial assistance

## 2020-09-09 NOTE — Telephone Encounter (Signed)
Was able to get in touch with pt, advised her RN faxed off her application on 08/28/2020 and it will take 4-6 weeks until she receives a letter in the mail stating if she has been approved for her PA on Entresto. Pt verbalized understanding, currently on her other line with her pharmacy "on hold" to see about coverage until she is approved, she stated she cannot afford OFP expense. Stated she may call back for free samples for coverage.

## 2020-10-07 NOTE — Telephone Encounter (Signed)
Noted  

## 2020-10-07 NOTE — Telephone Encounter (Signed)
Patient approved for assistance with entresto through [employer] per letter mailed to office .

## 2020-11-10 ENCOUNTER — Encounter: Payer: Medicare HMO | Admitting: Cardiology

## 2020-11-11 ENCOUNTER — Encounter: Payer: Self-pay | Admitting: Cardiology

## 2020-11-15 ENCOUNTER — Encounter: Payer: Self-pay | Admitting: Cardiovascular Disease

## 2021-03-08 ENCOUNTER — Telehealth: Payer: Self-pay | Admitting: Cardiovascular Disease

## 2021-03-08 ENCOUNTER — Other Ambulatory Visit: Payer: Self-pay

## 2021-03-08 ENCOUNTER — Other Ambulatory Visit: Payer: Self-pay | Admitting: Cardiovascular Disease

## 2021-03-08 MED ORDER — ENTRESTO 24-26 MG PO TABS: 1.0000 | ORAL_TABLET | Freq: Two times a day (BID) | ORAL | 0 refills | Status: DC

## 2021-03-08 MED ORDER — ENTRESTO 24-26 MG PO TABS: 1.0000 | ORAL_TABLET | Freq: Two times a day (BID) | ORAL | 3 refills | Status: DC

## 2021-03-08 NOTE — Telephone Encounter (Signed)
Please schedule overdue F/U appointment (in person). Thank you!

## 2021-03-08 NOTE — Telephone Encounter (Signed)
Please schedule overdue F/U appointment. Thank you! ?

## 2021-03-08 NOTE — Telephone Encounter (Signed)
sacubitril-valsartan (ENTRESTO) 24-26 MG 60 tablet 3 03/08/2021    Sig - Route: Take 1 tablet by mouth 2 (two) times daily. - Oral   Sent to pharmacy as: sacubitril-valsartan (ENTRESTO) 24-26 MG   E-Prescribing Status: Sent to pharmacy (03/08/2021  2:47 PM EDT)     Pharmacy  [id number] - [place], [place] - [address]

## 2021-03-08 NOTE — Telephone Encounter (Signed)
*  STAT* If patient is at the pharmacy, call can be transferred to refill team.   1. Which medications need to be refilled? (please list name of each medication and dose if known)   Entresto 24-26 mg po BID   2. Which pharmacy/location (including street and city if local pharmacy) is medication to be sent to? [facility] [address]   3. Do they need a 30 day or 90 day supply?  90  Patient is out of meds

## 2021-03-08 NOTE — Telephone Encounter (Signed)
Scheduled

## 2021-03-14 NOTE — Telephone Encounter (Signed)
Patient assistance for Entresto through [employer] has been approved through 08/13/21.

## 2021-03-31 ENCOUNTER — Telehealth: Payer: Self-pay | Admitting: Cardiovascular Disease

## 2021-03-31 NOTE — Telephone Encounter (Signed)
  Patient Consent for Virtual Visit        Penny Munoz has provided verbal consent on 11/22/2021 for a virtual visit (video or telephone).   CONSENT FOR VIRTUAL VISIT FOR:  Penny Munoz  By participating in this virtual visit I agree to the following:  I hereby voluntarily request, consent and authorize [facility] and its employed or contracted physicians, physician assistants, nurse practitioners or other licensed health care professionals (the Practitioner), to provide me with telemedicine health care services (the "Services") as deemed necessary by the treating Practitioner. I acknowledge and consent to receive the Services by the Practitioner via telemedicine. I understand that the telemedicine visit will involve communicating with the Practitioner through live audiovisual communication technology and the disclosure of certain medical information by electronic transmission. I acknowledge that I have been given the opportunity to request an in-person assessment or other available alternative prior to the telemedicine visit and am voluntarily participating in the telemedicine visit.  I understand that I have the right to withhold or withdraw my consent to the use of telemedicine in the course of my care at any time, without affecting my right to future care or treatment, and that the Practitioner or I may terminate the telemedicine visit at any time. I understand that I have the right to inspect all information obtained and/or recorded in the course of the telemedicine visit and may receive copies of available information for a reasonable fee.  I understand that some of the potential risks of receiving the Services via telemedicine include:  Delay or interruption in medical evaluation due to technological equipment failure or disruption; Information transmitted may not be sufficient (e.g. poor resolution of images) to allow for appropriate medical decision making by the  Practitioner; and/or  In rare instances, security protocols could fail, causing a breach of personal health information.  Furthermore, I acknowledge that it is my responsibility to provide information about my medical history, conditions and care that is complete and accurate to the best of my ability. I acknowledge that Practitioner's advice, recommendations, and/or decision may be based on factors not within their control, such as incomplete or inaccurate data provided by me or distortions of diagnostic images or specimens that may result from electronic transmissions. I understand that the practice of medicine is not an exact science and that Practitioner makes no warranties or guarantees regarding treatment outcomes. I acknowledge that a copy of this consent can be made available to me via my patient portal [facility] MyChart), or I can request a printed copy by calling the office of [facility].    I understand that my insurance will be billed for this visit.   I have read or had this consent read to me. I understand the contents of this consent, which adequately explains the benefits and risks of the Services being provided via telemedicine.  I have been provided ample opportunity to ask questions regarding this consent and the Services and have had my questions answered to my satisfaction. I give my informed consent for the services to be provided through the use of telemedicine in my medical care

## 2021-04-04 ENCOUNTER — Telehealth: Payer: Self-pay | Admitting: Cardiovascular Disease

## 2021-04-04 NOTE — Telephone Encounter (Signed)
Pt's daughter appeared in office today. Pt was given a patient assistance form and also a free 30 day coupon to take to the pharmacy. Pt will submit application to see if she is able to get assistance with her copay.

## 2021-04-04 NOTE — Telephone Encounter (Signed)
Patient calling the office for samples of medication:   1.  What medication and dosage are you requesting samples for? entresto  2.  Are you currently out of this medication? Yes  Patient is out of meds and doesn't know how to get rx after being approved for assistance through [employer].  Patient daughter can come now to pick up samples but is leaving today to fly out of town.  Patient has no transportation.

## 2021-04-05 ENCOUNTER — Other Ambulatory Visit: Payer: Self-pay | Admitting: Cardiovascular Disease

## 2021-04-19 NOTE — Telephone Encounter (Signed)
Returned the patients  call. Unable to lmom. Patients voicemail is not set up.  Per 03/14/21 documentation patient assistance for Entresto through [employer] has been approved through 08/13/21. The medication is suppose to be mailed to the patients home. Patient should call [employer] at [phone] to inquire.

## 2021-04-19 NOTE — Telephone Encounter (Signed)
Patient calling to check status of approval for assistance  States she completed the forms

## 2021-04-22 NOTE — Telephone Encounter (Signed)
[employer]. They confirmed that the patient has been approved for patient assistance for Entresto through 08/13/21. The patients medication is ready to ship. They have been trying  to reach the patient by telephone unsuccessfully. Per their instruction patient will need to call [phone] and speak with a rep so that the medication can be mailed.  I was able to call and speak with the patient. She will contact [employer] as instructed. Patient was relieved and appreciative for the assistance.

## 2021-04-27 ENCOUNTER — Other Ambulatory Visit: Payer: Self-pay | Admitting: Family Medicine

## 2021-04-27 DIAGNOSIS — Z1231 Encounter for screening mammogram for malignant neoplasm of breast: Secondary | ICD-10-CM

## 2021-05-04 ENCOUNTER — Encounter: Payer: Self-pay | Admitting: Cardiology

## 2021-05-04 ENCOUNTER — Ambulatory Visit (INDEPENDENT_AMBULATORY_CARE_PROVIDER_SITE_OTHER): Payer: Medicare HMO | Admitting: Cardiology

## 2021-05-04 ENCOUNTER — Other Ambulatory Visit: Payer: Self-pay

## 2021-05-04 VITALS — BP 128/80 | HR 63 | Ht 68.0 in | Wt 308.0 lb

## 2021-05-04 DIAGNOSIS — I5022 Chronic systolic (congestive) heart failure: Secondary | ICD-10-CM | POA: Diagnosis not present

## 2021-05-04 DIAGNOSIS — Z95810 Presence of automatic (implantable) cardiac defibrillator: Secondary | ICD-10-CM

## 2021-05-04 DIAGNOSIS — I10 Essential (primary) hypertension: Secondary | ICD-10-CM

## 2021-05-04 LAB — PACEMAKER DEVICE OBSERVATION

## 2021-05-04 NOTE — Patient Instructions (Signed)
Medication Instructions:  Your physician recommends that you continue on your current medications as directed. Please refer to the Current Medication list given to you today. *If you need a refill on your cardiac medications before your next appointment, please call your pharmacy*  Lab Work: None ordered. If you have labs (blood work) drawn today and your tests are completely normal, you will receive your results only by: MyChart Message (if you have MyChart) OR A paper copy in the mail If you have any lab test that is abnormal or we need to change your treatment, we will call you to review the results.  Testing/Procedures: None ordered.  Follow-Up: At [facility], you and your health needs are our priority.  As part of our continuing mission to provide you with exceptional heart care, we have created designated Provider Care Teams.  These Care Teams include your primary Cardiologist (physician) and Advanced Practice Providers (APPs -  Physician Assistants and Nurse Practitioners) who all work together to provide you with the care you need, when you need it.  Your next appointment:   Your physician wants you to follow-up in: one year with Dr. Lalla Brothers.  You will receive a reminder letter in the mail two months in advance. If you don't receive a letter, please call our office to schedule the follow-up appointment.  Remote monitoring is used to monitor your ICD from home.   You will get set up with device clinic [phone].

## 2021-05-04 NOTE — Progress Notes (Signed)
Electrophysiology Office Note:    Date:  05/04/2021   ID:  Penny Munoz, Penny Munoz Dec 15, 1950, MRN [id number]  PCP:  Leanna Sato, MD  [facility] Cardiologist:  None  [facility] Electrophysiologist:  None   Referring MD: Iran Ouch, MD   Chief Complaint: ICD in situ  History of Present Illness:    Penny Munoz is a 70 y.o. female who presents for an evaluation of ICD at the request of Dr. Kirke Corin. Their medical history includes COPD, chronic systolic heart failure, CAD, hypertension.  The patient was seen by Dr. Kirke Corin August 26, 2020.  Her ICD was implanted in 2004 at [facility].  At the appointment with Dr. Kirke Corin, she had NYHA class III symptoms.  Her last ejection fraction was 35 to 40% at the time of that appointment.  She is doing well.  She is presenting to become established in our device clinic.     Past Medical History:  Diagnosis Date   CHF (congestive heart failure) (HCC)    COPD (chronic obstructive pulmonary disease) (HCC)    Coronary artery disease    Hypertension     Past Surgical History:  Procedure Laterality Date   CARDIAC DEFIBRILLATOR PLACEMENT      Current Medications: Current Meds  Medication Sig   acetaminophen (TYLENOL) 325 MG tablet Take by mouth as needed.    albuterol (VENTOLIN HFA) 108 (90 Base) MCG/ACT inhaler Inhale into the lungs as needed.    allopurinol (ZYLOPRIM) 300 MG tablet Take 300 mg by mouth daily.   carvedilol (COREG) 12.5 MG tablet Take 12.5 mg by mouth 2 (two) times daily with a meal.   cetirizine (ZYRTEC) 10 MG tablet Take 10 mg by mouth daily.   citalopram (CELEXA) 20 MG tablet Take 20 mg by mouth daily.   fluticasone (FLONASE) 50 MCG/ACT nasal spray Place 1 spray into both nostrils daily.   furosemide (LASIX) 40 MG tablet Take 1 tablet (40 mg total) by mouth daily.   Garlic 400 MG TABS KYOLIC AGED GARLIC EXTRACT 600MG    HYDROcodone-acetaminophen (NORCO/VICODIN) 5-325 MG tablet Take  1 tablet by mouth every 6 (six) hours as needed.   Multiple Minerals (CALCIUM/MAGNESIUM/ZINC PO) Take by mouth. Takes 1 tablet every 3 days   Multiple Vitamins-Minerals (MULTIVITAMIN WITH MINERALS) tablet Take 1 tablet by mouth daily.   omeprazole (PRILOSEC) 20 MG capsule Take 20 mg by mouth daily.   sacubitril-valsartan (ENTRESTO) 24-26 MG Take 1 tablet by mouth 2 (two) times daily.   spironolactone (ALDACTONE) 25 MG tablet Take 1 tablet (25 mg total) by mouth daily.   VOLTAREN 1 % GEL Apply 1 a small amount to affected area four times a day     Allergies:   Hydrocodone, Oxycodone hcl, Ampicillin, Amoxicillin, and Penicillins   Social History   Socioeconomic History   Marital status: Married    Spouse name: Not on file   Number of children: Not on file   Years of education: Not on file   Highest education level: Not on file  Occupational History   Not on file  Tobacco Use   Smoking status: Never   Smokeless tobacco: Never  Vaping Use   Vaping Use: Never used  Substance and Sexual Activity   Alcohol use: No   Drug use: No   Sexual activity: Not on file  Other Topics Concern   Not on file  Social History Narrative   Not on file   Social Determinants of Health  Financial Resource Strain: Not on file  Food Insecurity: Not on file  Transportation Needs: Not on file  Physical Activity: Not on file  Stress: Not on file  Social Connections: Not on file     Family History: The patient's family history is not on file. She was adopted.  ROS:   Please see the history of present illness.    All other systems reviewed and are negative.  EKGs/Labs/Other Studies Reviewed:    The following studies were reviewed today:  August 05, 2020 echo personally reviewed Left ventricular function moderately reduced, 35% Right ventricular function low normal Mild to moderate MR Mild AI  May 04, 2021 in clinic device interrogation personally reviewed No ICD therapies have  not delivered Ventricular sensing 99.8% VVI 40 Lead parameter stable Remaining longevity 3.7 years    EKG:  The ekg ordered today demonstrates sinus rhythm.  Frequent PVCs.  Right bundle branch block.  Left anterior fascicular block.  Recent Labs: 06/23/2020: Creatinine, Ser 0.90  Recent Lipid Panel No results found for: CHOL, TRIG, HDL, CHOLHDL, VLDL, LDLCALC, LDLDIRECT  Physical Exam:    VS:  BP 128/80 (BP Location: Left Arm, Patient Position: Sitting, Cuff Size: Large)   Pulse 63   Ht 5\' 8"  (1.727 m)   Wt (!) 308 lb (139.7 kg)   SpO2 95%   BMI 46.83 kg/m     Wt Readings from Last 3 Encounters:  05/04/21 (!) 308 lb (139.7 kg)  08/26/20 280 lb (127 kg)  07/15/20 280 lb (127 kg)     GEN:  Well nourished, well developed in no acute distress.  Morbidly obese HEENT: Normal NECK: No JVD; No carotid bruits LYMPHATICS: No lymphadenopathy CARDIAC: RRR, no murmurs, rubs, gallops RESPIRATORY:  Clear to auscultation without rales, wheezing or rhonchi  ABDOMEN: Soft, non-tender, non-distended MUSCULOSKELETAL:  No edema; No deformity  SKIN: Warm and dry NEUROLOGIC:  Alert and oriented x 3 PSYCHIATRIC:  Normal affect   ASSESSMENT:    1. Chronic systolic heart failure (HCC)   2. Essential hypertension   3. ICD (implantable cardioverter-defibrillator) in place    PLAN:    In order of problems listed above:  #Chronic systolic heart failure Followed by Dr. 14/02/21 Continue therapy including Coreg, Lasix, Entresto, spironolactone  #ICD in situ Device functioning appropriately Will get patient established in our device clinic for routine remote monitoring.  Follow-up with me in 1 year.   Medication Adjustments/Labs and Tests Ordered: Current medicines are reviewed at length with the patient today.  Concerns regarding medicines are outlined above.  No orders of the defined types were placed in this encounter.  No orders of the defined types were placed in this  encounter.    Signed, Kirke Corin. Rossie Muskrat, MD, [facility], [facility] 05/04/2021 2:27 PM    Electrophysiology [facility]

## 2021-05-11 ENCOUNTER — Telehealth: Payer: Self-pay | Admitting: Oncology

## 2021-05-11 NOTE — Telephone Encounter (Signed)
Attempt made to reach patient x 2--no answer and mailbox is not full. Appointment on 11/10 rescheduled to 11/16.

## 2021-05-17 ENCOUNTER — Encounter: Payer: Self-pay | Admitting: Cardiovascular Disease

## 2021-05-17 ENCOUNTER — Telehealth (INDEPENDENT_AMBULATORY_CARE_PROVIDER_SITE_OTHER): Payer: Medicare HMO | Admitting: Cardiovascular Disease

## 2021-05-17 ENCOUNTER — Other Ambulatory Visit: Payer: Self-pay

## 2021-05-17 VITALS — Ht 68.0 in | Wt 300.0 lb

## 2021-05-17 DIAGNOSIS — I493 Ventricular premature depolarization: Secondary | ICD-10-CM

## 2021-05-17 DIAGNOSIS — I10 Essential (primary) hypertension: Secondary | ICD-10-CM

## 2021-05-17 DIAGNOSIS — I5022 Chronic systolic (congestive) heart failure: Secondary | ICD-10-CM

## 2021-05-17 NOTE — Patient Instructions (Signed)
Medication Instructions:  Your physician recommends that you continue on your current medications as directed. Please refer to the Current Medication list given to you today.  *If you need a refill on your cardiac medications before your next appointment, please call your pharmacy*   Lab Work: None ordered  We will request a copy of your most recent lab results from your primary care physician.  If you have labs (blood work) drawn today and your tests are completely normal, you will receive your results only by: MyChart Message (if you have MyChart) OR A paper copy in the mail If you have any lab test that is abnormal or we need to change your treatment, we will call you to review the results.   Testing/Procedures: None ordered   Follow-Up: At [facility], you and your health needs are our priority.  As part of our continuing mission to provide you with exceptional heart care, we have created designated Provider Care Teams.  These Care Teams include your primary Cardiologist (physician) and Advanced Practice Providers (APPs -  Physician Assistants and Nurse Practitioners) who all work together to provide you with the care you need, when you need it.  We recommend signing up for the patient portal called "MyChart".  Sign up information is provided on this After Visit Summary.  MyChart is used to connect with patients for Virtual Visits (Telemedicine).  Patients are able to view lab/test results, encounter notes, upcoming appointments, etc.  Non-urgent messages can be sent to your provider as well.   To learn more about what you can do with MyChart, go to [email or web address].    Your next appointment:   Your physician wants you to follow-up in: 6 months You will receive a reminder letter in the mail two months in advance. If you don't receive a letter, please call our office to schedule the follow-up appointment.   The format for your next appointment:   In Person  Provider:    You may see Lorine Bears, MD or one of the following Advanced Practice Providers on your designated Care Team:   Nicolasa Ducking, NP Eula Listen, PA-C Marisue Ivan, PA-C Cadence Fransico Michael, [place]   Other Instructions N/A

## 2021-05-17 NOTE — Progress Notes (Signed)
Virtual Visit via Telephone Note   This visit type was conducted due to national recommendations for restrictions regarding the COVID-19 Pandemic (e.g. social distancing) in an effort to limit this patient's exposure and mitigate transmission in our community.  Due to her co-morbid illnesses, this patient is at least at moderate risk for complications without adequate follow up.  This format is felt to be most appropriate for this patient at this time.  The patient did not have access to video technology/had technical difficulties with video requiring transitioning to audio format only (telephone).  All issues noted in this document were discussed and addressed.  No physical exam could be performed with this format.  Please refer to the patient's chart for her  consent to telehealth for [facility].    Date:  05/17/2021   ID:  Penny Munoz, DOB Jul 09, 1951, MRN [id number] The patient was identified using 2 identifiers.  Patient Location: Home Provider Location: Office/Clinic  PCP:  Leanna Sato, MD  Cardiologist:  Kirke Corin Electrophysiologist: Lalla Brothers   Evaluation Performed:  Follow-Up Visit  Chief Complaint: Shortness of breath.  History of Present Illness:    Penny Munoz is a 70 y.o. female who was reached via phone for follow-up visit regarding chronic systolic heart failure.  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 She moved to [place] around 2016. She quit smoking at least 5 years ago.   Most recent echocardiogram in December 2021 showed an EF of 35 to 40% with global hypokinesis, mild to moderate mitral regurgitation, mild aortic  regurgitation and grade 1 diastolic dysfunction. Has been doing well and denies any worsening shortness of breath or leg edema in spite of some weight gain.  She reports that her weight usually fluctuates.  He does report some sharp discomfort on the left side of the chest when she takes a deep breath but symptoms are overall mild.   The patient does not have symptoms concerning for COVID-19 infection (fever, chills, cough, or new shortness of breath).    Past Medical History:  Diagnosis Date   CHF (congestive heart failure) (HCC)    COPD (chronic obstructive pulmonary disease) (HCC)    Coronary artery disease    Hypertension    Past Surgical History:  Procedure Laterality Date   CARDIAC DEFIBRILLATOR PLACEMENT       Current Meds  Medication Sig   acetaminophen (TYLENOL) 325 MG tablet Take by mouth as needed.    albuterol (VENTOLIN HFA) 108 (90 Base) MCG/ACT inhaler Inhale into the lungs as needed.    allopurinol (ZYLOPRIM) 300 MG tablet Take 300 mg by mouth daily.   carvedilol (COREG) 12.5 MG tablet Take 12.5 mg by mouth 2 (two) times daily with a meal.   cetirizine (ZYRTEC) 10 MG tablet Take 10 mg by mouth daily.   citalopram (CELEXA) 20 MG tablet Take 20 mg by mouth daily.   fluticasone (FLONASE) 50 MCG/ACT nasal spray Place 1 spray into both nostrils daily.   furosemide (LASIX) 40 MG tablet Take 1 tablet (40 mg total) by mouth daily.   Garlic 400 MG TABS KYOLIC AGED GARLIC EXTRACT 600MG    HYDROcodone-acetaminophen (NORCO/VICODIN) 5-325 MG tablet Take 1 tablet by mouth every 6 (six) hours as needed.  Multiple Minerals (CALCIUM/MAGNESIUM/ZINC PO) Take by mouth. Takes 1 tablet every 3 days   Multiple Vitamins-Minerals (MULTIVITAMIN WITH MINERALS) tablet Take 1 tablet by mouth daily.   omeprazole (PRILOSEC) 20 MG capsule Take 20 mg by mouth daily.   sacubitril-valsartan (ENTRESTO) 24-26 MG Take 1 tablet by mouth 2 (two) times daily.   spironolactone (ALDACTONE) 25 MG tablet Take 1  tablet (25 mg total) by mouth daily.   VOLTAREN 1 % GEL Apply 1 a small amount to affected area four times a day     Allergies:   Hydrocodone, Oxycodone hcl, Ampicillin, Amoxicillin, and Penicillins   Social History   Tobacco Use   Smoking status: Never   Smokeless tobacco: Never  Vaping Use   Vaping Use: Never used  Substance Use Topics   Alcohol use: No   Drug use: No     Family Hx: The patient's family history is not on file. She was adopted.  ROS:   Please see the history of present illness.     All other systems reviewed and are negative.   Prior CV studies:   The following studies were reviewed today:    Labs/Other Tests and Data Reviewed:    EKG: Reviewed most recent EKG done recently in September which shows sinus rhythm with PVCs with right bundle branch block and left anterior fascicular block.  Recent Labs: 06/23/2020: Creatinine, Ser 0.90   Recent Lipid Panel No results found for: CHOL, TRIG, HDL, CHOLHDL, LDLCALC, LDLDIRECT  Wt Readings from Last 3 Encounters:  05/17/21 300 lb (136.1 kg)  05/04/21 (!) 308 lb (139.7 kg)  08/26/20 280 lb (127 kg)     Risk Assessment/Calculations:      Objective:    Vital Signs:  Ht 5\' 8"  (1.727 m)   Wt 300 lb (136.1 kg)   BMI 45.61 kg/m    VITAL SIGNS:  reviewed  ASSESSMENT & PLAN:    1.  Chronic systolic heart failure: Currently [place] heart association class III.  She has prolonged history of nonischemic cardiomyopathy since early 2000.  Most recent ejection fraction was 35 to 40%.  Continue carvedilol, Entresto and spironolactone.  She reports having labs done recently with her primary care physician and this will be requested.   I think she benefits from an SGLT2 inhibitor but will have to wait until we review her labs and have an office visit to discuss risk and benefits.  2.  Status post ICD placement: Followed by Dr. 2001.  3.  Essential hypertension: Blood pressure is controlled on current  medications.  4.  PVCs: Noted on EKG but she denies palpitations.  The burden seems to be overall low.  Continue beta-blocker.  5.  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  This is followed by oncology.      Time:   Today, I have spent 10 minutes with the patient with telehealth technology discussing the above problems.     Medication Adjustments/Labs and Tests Ordered: Current medicines are reviewed at length with the patient today.  Concerns regarding medicines are outlined above.   Tests Ordered: No orders of the defined types were placed in this encounter.   Medication Changes: No orders of the defined types were placed in this encounter.   Follow Up:  In Person in 6 months.  Signed, 2018, MD  05/17/2021 4:41 PM    [facility]

## 2021-05-23 ENCOUNTER — Telehealth: Payer: Self-pay | Admitting: *Deleted

## 2021-05-23 NOTE — Telephone Encounter (Signed)
Requested most recent labs from pcp. X3 no labs received.

## 2021-05-24 NOTE — Telephone Encounter (Signed)
Lab results received from the pt pcp. See 05/23/21 medica scanned correspondence.  Reviewed by Dr. Kirke Corin.  Iran Ouch, MD  Kendrick Fries, CMA; Jarvis Newcomer, RN Normal labs.  Continue same medications

## 2021-06-17 ENCOUNTER — Other Ambulatory Visit: Payer: Self-pay | Admitting: *Deleted

## 2021-06-17 MED ORDER — SPIRONOLACTONE 25 MG PO TABS: 25.0000 mg | ORAL_TABLET | Freq: Every day | ORAL | 0 refills | Status: DC

## 2021-06-22 ENCOUNTER — Telehealth: Payer: Self-pay | Admitting: Oncology

## 2021-06-22 ENCOUNTER — Ambulatory Visit: Admission: RE | Admit: 2021-06-22 | Payer: Medicare HMO | Source: Ambulatory Visit

## 2021-06-22 NOTE — Telephone Encounter (Signed)
Spoke with patient about scheduled CT scan (received auth today) and rescheduled NP appointment until after. Patient expressed understanding about all CT prep instructions and the location. She would like to have a virtual visit to go over results (she does not want to get exposed to flu and has difficulty leaving her husband). MD is aware and ok with this.

## 2021-06-23 ENCOUNTER — Other Ambulatory Visit: Payer: Medicare HMO

## 2021-06-23 ENCOUNTER — Ambulatory Visit: Payer: Medicare HMO | Admitting: Oncology

## 2021-06-29 ENCOUNTER — Telehealth: Payer: Self-pay | Admitting: Cardiovascular Disease

## 2021-06-29 ENCOUNTER — Other Ambulatory Visit: Payer: Self-pay

## 2021-06-29 ENCOUNTER — Other Ambulatory Visit: Payer: Medicare HMO

## 2021-06-29 ENCOUNTER — Ambulatory Visit: Payer: Self-pay | Admitting: Oncology

## 2021-06-29 MED ORDER — SPIRONOLACTONE 25 MG PO TABS: 25.0000 mg | ORAL_TABLET | Freq: Every day | ORAL | 0 refills | Status: DC

## 2021-06-29 NOTE — Telephone Encounter (Signed)
spironolactone (ALDACTONE) 25 MG tablet 90 tablet 0 06/29/2021 09/27/2021   Sig - Route: Take 1 tablet (25 mg total) by mouth daily. - Oral   Sent to pharmacy as: spironolactone (ALDACTONE) 25 MG tablet   E-Prescribing Status: Sent to pharmacy (06/29/2021  2:12 PM EST)    Pharmacy  [id number] - [place], [place] - [address]

## 2021-06-29 NOTE — Telephone Encounter (Signed)
*  STAT* If patient is at the pharmacy, call can be transferred to refill team.   1. Which medications need to be refilled? (please list name of each medication and dose if known) spironolactone 25 MG 1 tablet daily   2. Which pharmacy/location (including street and city if local pharmacy) is medication to be sent to? [address] [facility]   3. Do they need a 30 day or 90 day supply? 90 day

## 2021-06-30 ENCOUNTER — Ambulatory Visit: Payer: Medicare HMO | Admitting: Oncology

## 2021-06-30 ENCOUNTER — Other Ambulatory Visit: Payer: Medicare HMO

## 2021-07-12 ENCOUNTER — Other Ambulatory Visit: Payer: Self-pay | Admitting: *Deleted

## 2021-07-12 DIAGNOSIS — R1900 Intra-abdominal and pelvic swelling, mass and lump, unspecified site: Secondary | ICD-10-CM

## 2021-07-13 ENCOUNTER — Other Ambulatory Visit: Payer: Medicare HMO

## 2021-07-13 ENCOUNTER — Inpatient Hospital Stay: Payer: Medicare HMO | Attending: Nurse Practitioner

## 2021-07-13 ENCOUNTER — Other Ambulatory Visit: Payer: Self-pay

## 2021-07-13 DIAGNOSIS — R1900 Intra-abdominal and pelvic swelling, mass and lump, unspecified site: Secondary | ICD-10-CM | POA: Diagnosis not present

## 2021-07-13 LAB — CBC WITH DIFFERENTIAL/PLATELET
Abs Immature Granulocytes: 0.02 10*3/uL (ref 0.00–0.07)
Basophils Absolute: 0 10*3/uL (ref 0.0–0.1)
Basophils Relative: 1 %
Eosinophils Absolute: 0.2 10*3/uL (ref 0.0–0.5)
Eosinophils Relative: 2 %
HCT: 40.7 % (ref 36.0–46.0)
Hemoglobin: 13.5 g/dL (ref 12.0–15.0)
Immature Granulocytes: 0 %
Lymphocytes Relative: 35 %
Lymphs Abs: 2.6 10*3/uL (ref 0.7–4.0)
MCH: 30.7 pg (ref 26.0–34.0)
MCHC: 33.2 g/dL (ref 30.0–36.0)
MCV: 92.5 fL (ref 80.0–100.0)
Monocytes Absolute: 0.4 10*3/uL (ref 0.1–1.0)
Monocytes Relative: 5 %
Neutro Abs: 4.4 10*3/uL (ref 1.7–7.7)
Neutrophils Relative %: 57 %
Platelets: 234 10*3/uL (ref 150–400)
RBC: 4.4 MIL/uL (ref 3.87–5.11)
RDW: 13.9 % (ref 11.5–15.5)
WBC: 7.6 10*3/uL (ref 4.0–10.5)
nRBC: 0 % (ref 0.0–0.2)

## 2021-07-13 LAB — COMPREHENSIVE METABOLIC PANEL
ALT: 32 U/L (ref 0–44)
AST: 32 U/L (ref 15–41)
Albumin: 4 g/dL (ref 3.5–5.0)
Alkaline Phosphatase: 97 U/L (ref 38–126)
Anion gap: 5 (ref 5–15)
BUN: 13 mg/dL (ref 8–23)
CO2: 24 mmol/L (ref 22–32)
Calcium: 9.1 mg/dL (ref 8.9–10.3)
Chloride: 105 mmol/L (ref 98–111)
Creatinine, Ser: 0.71 mg/dL (ref 0.44–1.00)
GFR, Estimated: >60 mL/min (ref >60)
Glucose, Bld: 105 mg/dL — ABNORMAL HIGH (ref 70–99)
Potassium: 3.9 mmol/L (ref 3.5–5.1)
Sodium: 134 mmol/L — ABNORMAL LOW (ref 135–145)
Total Bilirubin: 0.7 mg/dL (ref 0.3–1.2)
Total Protein: 7.7 g/dL (ref 6.5–8.1)

## 2021-07-14 ENCOUNTER — Other Ambulatory Visit: Payer: Medicare HMO

## 2021-07-14 ENCOUNTER — Ambulatory Visit: Admission: RE | Admit: 2021-07-14 | Payer: Medicare HMO | Source: Ambulatory Visit

## 2021-07-19 ENCOUNTER — Telehealth: Payer: Self-pay | Admitting: *Deleted

## 2021-07-19 ENCOUNTER — Other Ambulatory Visit: Payer: Self-pay

## 2021-07-19 ENCOUNTER — Inpatient Hospital Stay (HOSPITAL_BASED_OUTPATIENT_CLINIC_OR_DEPARTMENT_OTHER): Payer: Medicare HMO | Admitting: Nurse Practitioner

## 2021-07-19 DIAGNOSIS — R1900 Intra-abdominal and pelvic swelling, mass and lump, unspecified site: Secondary | ICD-10-CM

## 2021-07-19 NOTE — Telephone Encounter (Signed)
Attempted to call pt for RN assessment prior to My chart visit with provider. No answer. It appears pt has also canceled the scheduled CT scan that was ordered prior to visit.

## 2021-07-19 NOTE — Progress Notes (Signed)
Patient did not answer for virtual visit and did not have CT scan. I've asked scheduling to contact her to reschedule.

## 2021-07-27 ENCOUNTER — Other Ambulatory Visit: Payer: Medicare HMO

## 2021-07-29 ENCOUNTER — Ambulatory Visit: Payer: Medicare HMO | Admitting: Nurse Practitioner

## 2021-08-16 ENCOUNTER — Ambulatory Visit: Payer: Medicare HMO

## 2021-08-16 ENCOUNTER — Other Ambulatory Visit: Payer: Self-pay

## 2021-08-16 ENCOUNTER — Ambulatory Visit
Admission: RE | Admit: 2021-08-16 | Discharge: 2021-08-16 | Disposition: A | Discharge status: Home / Self Care | Payer: Medicare HMO | Source: Ambulatory Visit | Attending: Oncology | Admitting: Oncology

## 2021-08-16 DIAGNOSIS — R1900 Intra-abdominal and pelvic swelling, mass and lump, unspecified site: Secondary | ICD-10-CM | POA: Diagnosis present

## 2021-08-16 LAB — POCT I-STAT CREATININE: Creatinine, Ser: 0.8 mg/dL (ref 0.44–1.00)

## 2021-08-16 MED ORDER — IOHEXOL 350 MG/ML SOLN
100.0000 mL | Freq: Once | INTRAVENOUS | Status: AC | PRN
  Administered 2021-08-16: 100 mL via INTRAVENOUS

## 2021-08-18 ENCOUNTER — Ambulatory Visit: Payer: Medicare HMO | Admitting: Nurse Practitioner

## 2021-08-18 ENCOUNTER — Encounter: Payer: Self-pay | Admitting: Nurse Practitioner

## 2021-08-18 ENCOUNTER — Inpatient Hospital Stay: Payer: Medicare HMO | Attending: Nurse Practitioner | Admitting: Nurse Practitioner

## 2021-08-18 DIAGNOSIS — R1900 Intra-abdominal and pelvic swelling, mass and lump, unspecified site: Secondary | ICD-10-CM | POA: Diagnosis not present

## 2021-08-18 NOTE — Progress Notes (Signed)
[place] at [place]  I connected with [place] on 08/18/21 at  1:30 PM EST by telephone visit and verified that I am speaking with the correct person using two identifiers.   I discussed the limitations, risks, security and privacy concerns of performing an evaluation and management service by telemedicine and the availability of in-person appointments. I also discussed with the patient that there may be a patient responsible charge related to this service. The patient expressed understanding and agreed to proceed.  Other persons participating in the visit and their role in the encounter:  none  Patient's location:  home Provider's location:  work  Diagnosis-retroperitoneal mass likely secondary to lipoma  Chief complaint/ Reason for visit- discuss ct scan results  Heme/Onc history: patient is a 71 year old female with a past medical history significant for COPD CHF, nonischemic cardiomyopathy, GERD among other medical problems.  She was seen by Dr. Maudie Mercury from [facility] back in 2017 when she was found to have a 14.6 x 12 x 7.3 cm mass in the right retroperitoneum that displaces the duodenum and pancreatic head anteromedially.  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  Initially the plan was biopsied but that was thought to be a low yield and she had multiple comorbidities and was not deemed to be a surgical candidate.   A repeat scan 3 months later did not reveal any change in the size of her mass and given the low probability of malignancy she was asked to follow-up on an as-needed basis.   Patient now wants to establish follow-up as she is concerned about ongoing pain in her right flank that radiates to the front.  She is concerned that the mass is growing and something needs to be done about it.  She is concerned about the way her nails  look and her ongoing fatigue.  She has not had any unintentional weight loss.  She has been caring for her husband who has had multiple CVAs.   Patient reports ongoing left ear pain which has been going on for a couple of months.   Patient also states that she had a cardiologist out of town but she now wants to establish care in [place] and does not have cardiology care set up here.  She reports that she has AICD placement.    Interval history: Patient with above history of retroperitoneal mass, thought to be lipomatous mass, followed by imaging, agrees to evaluation by telemedicine to discuss results of recent ct scan. She continues to report intermittent pain on right flank that radiates to her front. By her own report, her health is stable. Not worsening.    Review of Systems  Constitutional:  Positive for malaise/fatigue. Negative for chills, fever and weight loss.  HENT:  Negative for congestion, ear discharge and nosebleeds.   Eyes:  Negative for blurred vision.  Respiratory:  Negative for cough, hemoptysis, sputum production, shortness of breath and wheezing.   Cardiovascular:  Negative for chest pain, palpitations, orthopnea and claudication.  Gastrointestinal:  Negative for abdominal pain, blood in stool, constipation, diarrhea, heartburn, melena, nausea and vomiting.  Genitourinary:  Negative for dysuria, flank pain, frequency, hematuria and urgency.       Right flank pain chronic  Musculoskeletal:  Negative for back pain, joint pain and myalgias.  Skin:  Negative for rash.  Neurological:  Negative for  dizziness, tingling, focal weakness, seizures, weakness and headaches.  Endo/Heme/Allergies:  Does not bruise/bleed easily.  Psychiatric/Behavioral:  Negative for depression and suicidal ideas. The patient does not have insomnia.     Allergies  Allergen Reactions   Hydrocodone Other (See Comments)    "took one pill can't tolerate" "took one pill can't tolerate"    Oxycodone  Hcl Shortness Of Breath   Ampicillin     Other reaction(s): Angioedema Other reaction(s): Angioedema    Amoxicillin Rash   Penicillins Anxiety    Past Medical History:  Diagnosis Date   CHF (congestive heart failure) (HCC)    COPD (chronic obstructive pulmonary disease) (HCC)    Coronary artery disease    Hypertension     Past Surgical History:  Procedure Laterality Date   CARDIAC DEFIBRILLATOR PLACEMENT      Social History   Socioeconomic History   Marital status: Married    Spouse name: Not on file   Number of children: Not on file   Years of education: Not on file   Highest education level: Not on file  Occupational History   Not on file  Tobacco Use   Smoking status: Never   Smokeless tobacco: Never  Vaping Use   Vaping Use: Never used  Substance and Sexual Activity   Alcohol use: No   Drug use: No   Sexual activity: Not on file  Other Topics Concern   Not on file  Social History Narrative   Not on file   Social Determinants of Health   Financial Resource Strain: Not on file  Food Insecurity: Not on file  Transportation Needs: Not on file  Physical Activity: Not on file  Stress: Not on file  Social Connections: Not on file  Intimate Partner Violence: Not on file    Family History  Adopted: Yes    Current Outpatient Medications:    acetaminophen (TYLENOL) 325 MG tablet, Take by mouth as needed. , Disp: , Rfl:    albuterol (VENTOLIN HFA) 108 (90 Base) MCG/ACT inhaler, Inhale into the lungs as needed. , Disp: , Rfl:    allopurinol (ZYLOPRIM) 300 MG tablet, Take 300 mg by mouth daily., Disp: , Rfl:    carvedilol (COREG) 12.5 MG tablet, Take 12.5 mg by mouth 2 (two) times daily with a meal., Disp: , Rfl:    cetirizine (ZYRTEC) 10 MG tablet, Take 10 mg by mouth daily., Disp: , Rfl:    citalopram (CELEXA) 20 MG tablet, Take 20 mg by mouth daily., Disp: , Rfl:    fluticasone (FLONASE) 50 MCG/ACT nasal spray, Place 1 spray into both nostrils daily.,  Disp: , Rfl:    furosemide (LASIX) 40 MG tablet, Take 1 tablet (40 mg total) by mouth daily., Disp: 30 tablet, Rfl: 1   Garlic [id number] MG TABS, KYOLIC AGED GARLIC EXTRACT 600MG , Disp: , Rfl:    HYDROcodone-acetaminophen (NORCO/VICODIN) 5-325 MG tablet, Take 1 tablet by mouth every 6 (six) hours as needed., Disp: 15 tablet, Rfl: 0   Multiple Minerals (CALCIUM/MAGNESIUM/ZINC PO), Take by mouth. Takes 1 tablet every 3 days, Disp: , Rfl:    Multiple Vitamins-Minerals (MULTIVITAMIN WITH MINERALS) tablet, Take 1 tablet by mouth daily., Disp: , Rfl:    omeprazole (PRILOSEC) 20 MG capsule, Take 20 mg by mouth daily., Disp: , Rfl:    sacubitril-valsartan (ENTRESTO) 24-26 MG, Take 1 tablet by mouth 2 (two) times daily., Disp: 180 tablet, Rfl: 0   spironolactone (ALDACTONE) 25 MG tablet, Take 1 tablet (25 mg total)  by mouth daily., Disp: 90 tablet, Rfl: 0   VOLTAREN 1 % GEL, Apply 1 a small amount to affected area four times a day, Disp: , Rfl:   CT Abdomen Pelvis W Contrast  Result Date: 08/16/2021 CLINICAL DATA:  Retroperitoneal mass follow-up EXAM: CT ABDOMEN AND PELVIS WITH CONTRAST TECHNIQUE: Multidetector CT imaging of the abdomen and pelvis was performed using the standard protocol following bolus administration of intravenous contrast. CONTRAST:  15mL OMNIPAQUE IOHEXOL 350 MG/ML SOLN COMPARISON:  CT abdomen and pelvis 06/23/2020 FINDINGS: Lower chest: No acute abnormality. Hepatobiliary: Liver is normal in size and contour with no suspicious mass identified. Small calcified stone in the dependent gallbladder. No biliary ductal dilatation. Pancreas: No suspicious mass or ductal dilatation identified. Head of the pancreas is again displaced anteriorly secondary to the fatty mass. Spleen: Normal in size without focal abnormality. Adrenals/Urinary Tract: Adrenal glands appear normal. 1 cm hypodense cyst in the upper pole the right kidney no hydronephrosis. Urinary bladder appears grossly normal. Stomach/Bowel:  Small hiatal hernia. No bowel obstruction, free air or pneumatosis. Colonic diverticulosis without evidence of acute diverticulitis. Moderate-to-large amount of retained fecal material in the colon. Appendix is normal. Vascular/Lymphatic: Aortic atherosclerosis. No enlarged abdominal or pelvic lymph nodes. Reproductive: Small calcified uterine fibroids. No suspicious adnexal mass identified. Other: Redemonstration of the fat density mass in the right retroperitoneum centered posterior to the second portion of the duodenum and pancreatic head which measures 16 x 9 cm, very similar to previous study. No definite new solid nodular components visualized within the mass area. Musculoskeletal: Degenerative changes of the lumbar spine. No suspicious bony lesions identified. IMPRESSION: 1. No significant change in size or appearance of the fatty right retroperitoneal mass measuring 16 x 9 cm. Again most consistent with a lipoma, although as previously noted a low-grade liposarcoma can not be reliably distinguished from lipoma by imaging. 2. Colonic diverticulosis. 3. Cholelithiasis. 4. Small hiatal hernia. 5. Other chronic findings as described. Electronically Signed   By: Ofilia Neas M.D.   On: 08/16/2021 14:38     No images are attached to the encounter.   CMP Latest Ref Rng & Units 08/16/2021  Glucose 70 - 99 mg/dL -  BUN 8 - 23 mg/dL -  Creatinine 0.44 - 1.00 mg/dL 0.80  Sodium 135 - 145 mmol/L -  Potassium 3.5 - 5.1 mmol/L -  Chloride 98 - 111 mmol/L -  CO2 22 - 32 mmol/L -  Calcium 8.9 - 10.3 mg/dL -  Total Protein 6.5 - 8.1 g/dL -  Total Bilirubin 0.3 - 1.2 mg/dL -  Alkaline Phos 38 - 126 U/L -  AST 15 - 41 U/L -  ALT 0 - 44 U/L -   CBC Latest Ref Rng & Units 07/13/2021  WBC 4.0 - 10.5 K/uL 7.6  Hemoglobin 12.0 - 15.0 g/dL 13.5  Hematocrit 36.0 - 46.0 % 40.7  Platelets 150 - 400 K/uL 234     Observation/objective: patient declines video visit. Alert and no obvious respiratory  distress  Assessment and plan: Patient is 71 year old female with multiple medical issues seen for lipomatous area of the right retroperitoneum. CT from 2017 was described as a 15 cm lipomatous mass at the right retroperitoneum near completely composed of fat with minimal soft tissue components or concerning features.  Patient was felt not to be a surgical candidate based on her other medical comorbidities and has been followed with CT scans.  In 2020 mass was compared to and to slightly 0.8 x  8.9 cm.  Differential included low-grade outpatient, versus benign lipoma.  Imaging was repeated 08/16/21 reviewed mass measuring approximately 16 x 9 cm very similar to previous study.  No definite new solid nodular components.  No associated lymphadenopathy.  Findings consistent with lipoma.  Patient is not a surgical candidate and given stability of these findings she can be released from surveillance.  She is highly anxious and I did offer her second opinion at [facility] urgent which she declines at this time.  Disposition: Follow up with PCP. No new appointments at the cancer center at this time.   I spent 25 minutes on this telephone encounter.   I discussed the assessment and treatment plan with the patient. The patient was provided an opportunity to ask questions and all were answered. The patient agreed with the plan and demonstrated an understanding of the instructions.   The patient was advised to call back or seek an in-person evaluation if the symptoms worsen or if the condition fails to improve as anticipated.  Visit Diagnosis: 1. Retroperitoneal mass    Beckey Rutter, DNP, AGNP-C [place] at [facility] [phone] (clinic) 08/18/2021  CC: Dr. Lennox Grumbles

## 2021-09-20 ENCOUNTER — Telehealth: Payer: Self-pay

## 2021-09-20 NOTE — Telephone Encounter (Signed)
The patient states her monitor do not work. I ordered her a new monitor. She should receive it in 7-10 business days.

## 2021-10-03 ENCOUNTER — Telehealth: Payer: Self-pay | Admitting: Cardiovascular Disease

## 2021-10-03 MED ORDER — ENTRESTO 24-26 MG PO TABS: 1.0000 | ORAL_TABLET | Freq: Two times a day (BID) | ORAL | 3 refills | Status: DC

## 2021-10-03 NOTE — Telephone Encounter (Signed)
Spoke with patient and reviewed requirements for patient assistance application. Advised we would need tax return or social security statements from both her and spouse in order to submit application. We also need signature on application. She verbalized understanding and will be by the office tomorrow for [place] to copy her statements and sign form. Application has been completed and will give to Dr. Jari Sportsman nurse.

## 2021-10-03 NOTE — Telephone Encounter (Signed)
Patient is needing [employer] for Entresto medication

## 2021-10-05 NOTE — Telephone Encounter (Signed)
Application completed, income received, and faxed to company. Filing in samples closet.

## 2021-11-04 ENCOUNTER — Telehealth: Payer: Self-pay

## 2021-11-04 ENCOUNTER — Other Ambulatory Visit: Payer: Self-pay

## 2021-11-04 MED ORDER — ENTRESTO 24-26 MG PO TABS: 1.0000 | ORAL_TABLET | Freq: Two times a day (BID) | ORAL | 0 refills | Status: DC

## 2021-11-04 NOTE — Telephone Encounter (Signed)
Patient is requesting Entresto samples. States she cannot afford this medication.

## 2021-11-04 NOTE — Telephone Encounter (Signed)
Spoke with patient and informed her that she was approved to get the medication free of cost through [employer] patient assistance but she states she has not received the medication yet. Advised patient to contact [employer]. She called them and they told her it would be delivered on Tuesday, 11/08/21. She states she is going to pick up enough medication from her pharmacy to get her through until it is delivered. ?

## 2021-12-02 ENCOUNTER — Other Ambulatory Visit: Payer: Self-pay | Admitting: Cardiovascular Disease

## 2021-12-05 NOTE — Telephone Encounter (Signed)
Please schedule 6 month F/u appointment for 90 day refills. Thank you! ?

## 2021-12-15 ENCOUNTER — Other Ambulatory Visit: Payer: Self-pay | Admitting: *Deleted

## 2021-12-16 ENCOUNTER — Telehealth: Payer: Self-pay | Admitting: Cardiovascular Disease

## 2021-12-16 ENCOUNTER — Other Ambulatory Visit: Payer: Self-pay

## 2021-12-16 MED ORDER — SPIRONOLACTONE 25 MG PO TABS: 25.0000 mg | ORAL_TABLET | Freq: Every day | ORAL | 0 refills | Status: DC

## 2021-12-16 NOTE — Telephone Encounter (Signed)
Please schedule 6 month F/U appointment. Thank you! 

## 2021-12-16 NOTE — Telephone Encounter (Signed)
30 day refill sent as requested. Patient needs to schedule F/U appointment for further refills.  ?

## 2021-12-16 NOTE — Telephone Encounter (Signed)
?*  STAT* If patient is at the pharmacy, call can be transferred to refill team. ? ? ?1. Which medications need to be refilled? (please list name of each medication and dose if known) spironolactone (ALDACTONE) 25 MG tablet  ? ?2. Which pharmacy/location (including street and city if local pharmacy) is medication to be sent to? [place], [place] ? ?3. Do they need a 30 day or 90 day supply? 30 day  ? ?Patient is out of medication ?

## 2021-12-21 MED ORDER — SPIRONOLACTONE 25 MG PO TABS: 25.0000 mg | ORAL_TABLET | Freq: Every day | ORAL | 0 refills | Status: DC

## 2021-12-22 NOTE — Telephone Encounter (Signed)
Scheduled

## 2022-02-15 IMAGING — CT CT ABD-PELV W/ CM
2 of 5 series · 14 of 46 positions shown, 16 images · IV contrast (APPLIED)
Comparison: 05/12/2019
COMPARISON: 05/12/2019

Addendum:
CLINICAL DATA: Follow-up retroperitoneal mass, likely lipoma versus
low-grade liposarcoma

EXAM:
CT ABDOMEN AND PELVIS WITH CONTRAST
TECHNIQUE: Multidetector CT imaging of the abdomen and pelvis was performed
using the standard protocol following bolus administration of
intravenous contrast.
CONTRAST:  125mL OMNIPAQUE IOHEXOL 300 MG/ML  SOLN chest

[Series 2: routine abd/pel with · axial · 0.98mm/px · z∈[-995,-585]mm · 11 of 94 slices shown, 13 images]
[im 6/94  soft-tissue]
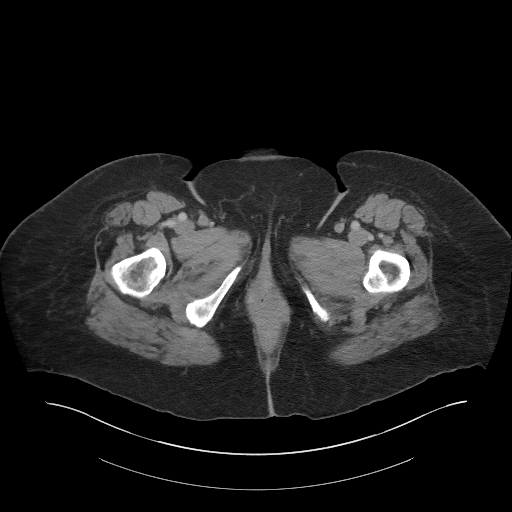
[im 6/94  bone]
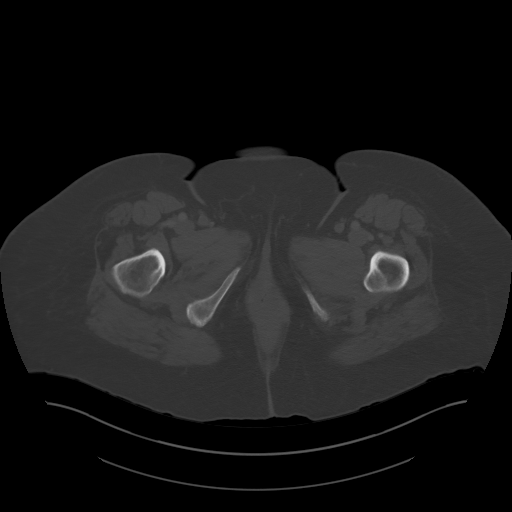
[im 16/94  soft-tissue]
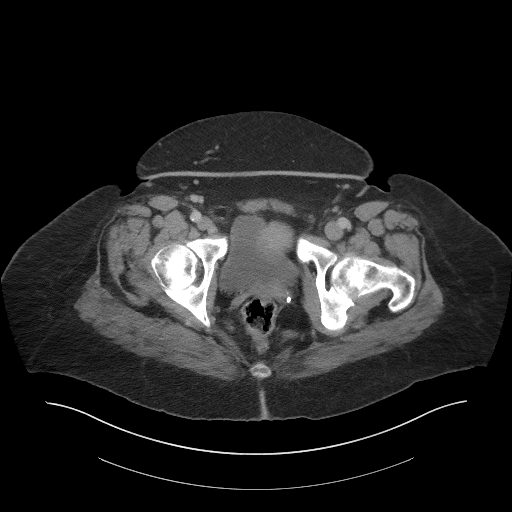
[im 21/94  soft-tissue]
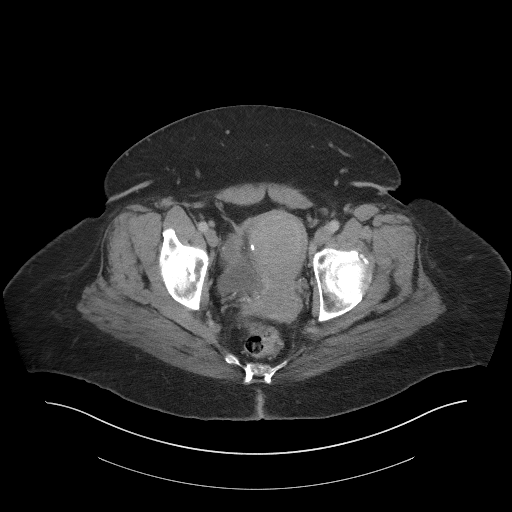
[im 32/94  soft-tissue]
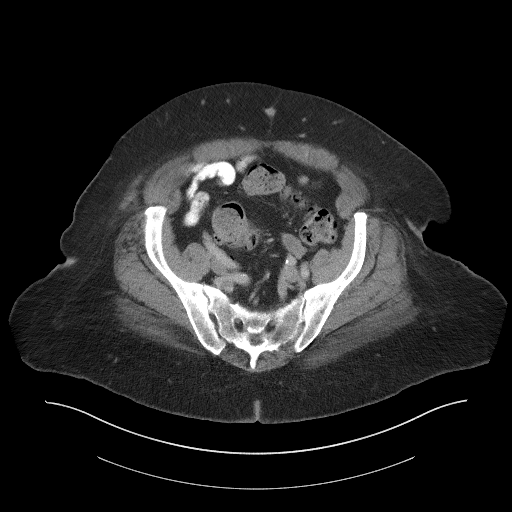
[im 37/94  soft-tissue]
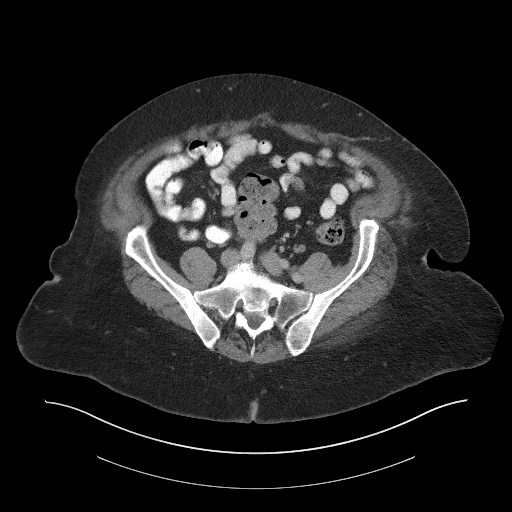
[im 47/94  soft-tissue]
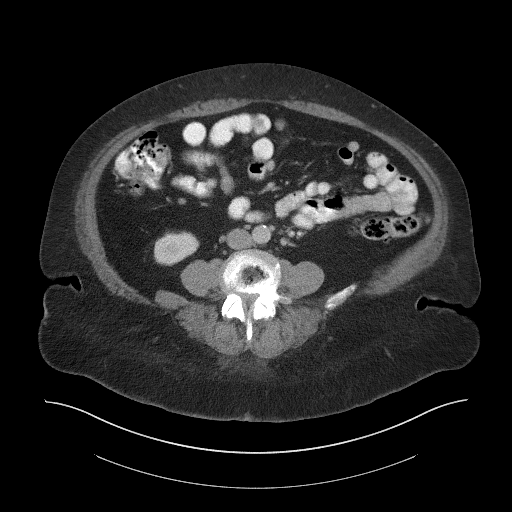
[im 57/94  soft-tissue]
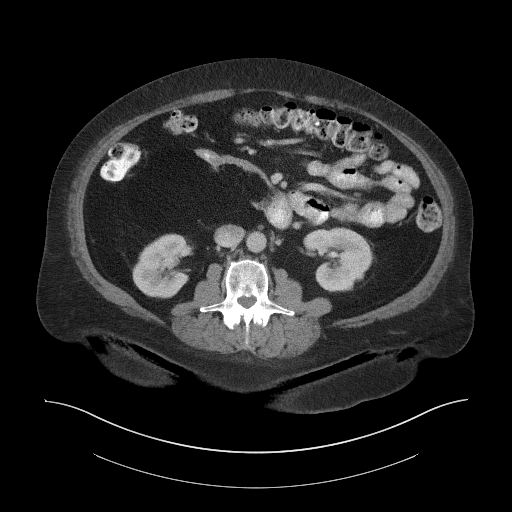
[im 63/94  soft-tissue]
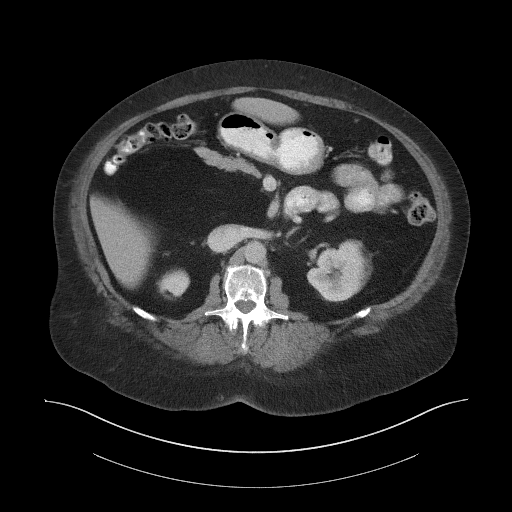
[im 73/94  soft-tissue]
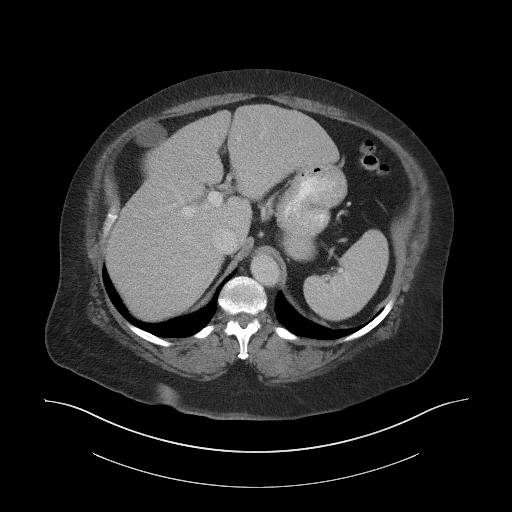
[im 73/94  bone]
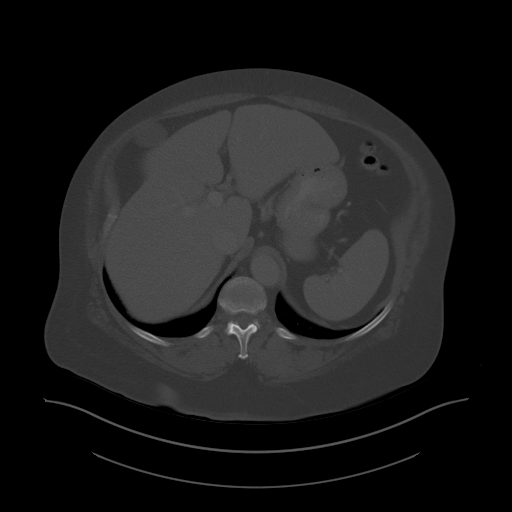
[im 78/94  soft-tissue]
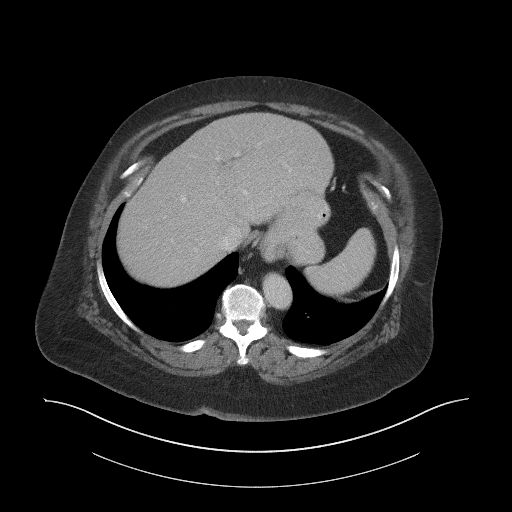
[im 88/94  soft-tissue]
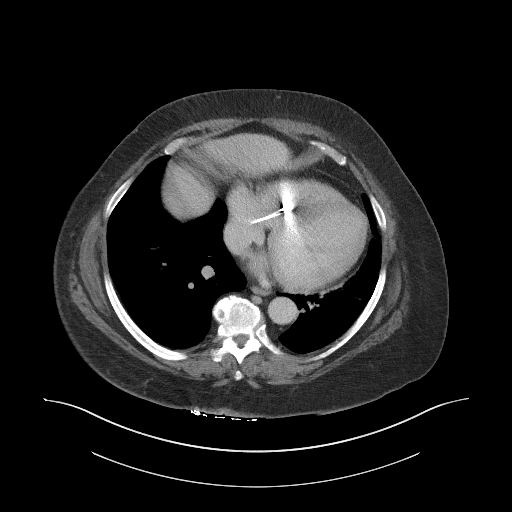

[Series 5: coronal st · coronal · 0.82mm/px · 3 of 111 slices shown]
[im 37/111  soft-tissue]
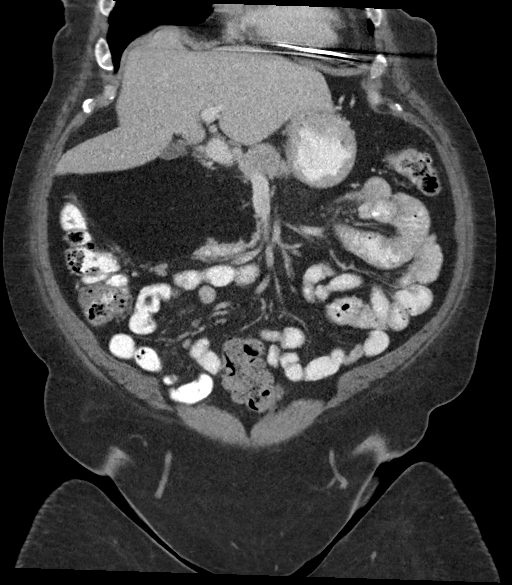
[im 49/111  soft-tissue]
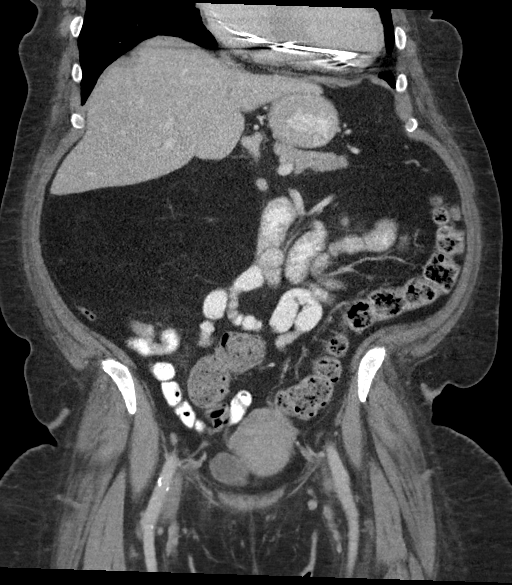
[im 62/111  soft-tissue]
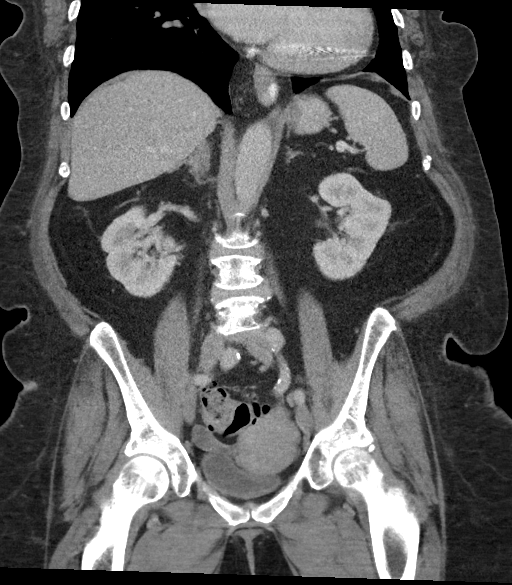

[14 of 46 positions shown; findings below may reference images not displayed]

FINDINGS: Lower chest: No acute abnormality.  Small hiatal hernia.

Hepatobiliary: No solid liver abnormality is seen. No gallstones,
gallbladder wall thickening, or biliary dilatation.

Pancreas: Unremarkable. No pancreatic ductal dilatation or
surrounding inflammatory changes.

Spleen: Normal in size without significant abnormality.

Adrenals/Urinary Tract: Adrenal glands are unremarkable. Kidneys are
normal, without renal calculi, solid lesion, or hydronephrosis.
Bladder is unremarkable.

Stomach/Bowel: Stomach is within normal limits. Appendix appears
normal. No evidence of bowel wall thickening, distention, or
inflammatory changes. Sigmoid diverticulosis.

Vascular/Lymphatic: Aortic atherosclerosis. No enlarged abdominal or
pelvic lymph nodes.

Reproductive: No mass or other significant abnormality. Calcified
uterine fibroids.

Other: No abdominal wall hernia or abnormality. No abdominopelvic
ascites. No significant interval change in a large right fatty mass
in the right retroperitoneum, centered posterior to the duodenum and
pancreatic head, displacing the structures anteriorly. This mass
measures approximately 15.0 x 8.9 x cm.

Musculoskeletal: No acute or significant osseous findings.
IMPRESSION: 1. No significant interval change in a large right fatty mass in the
right retroperitoneum, centered posterior to the duodenum and
pancreatic head, displacing the structures anteriorly. This mass
measures approximately 15.0 x 8.9 x cm. Findings are most consistent
with a lipoma, although as previously noted low-grade liposarcoma
cannot be reliably distinguished from benign lipoma by imaging.
2. Sigmoid diverticulosis without evidence of diverticulitis.
3. Small hiatal hernia.
4. Aortic Atherosclerosis (HDLV6-QKO.O).

ADDENDUM:
Additional comparison requested to more remote prior outside
examination dated 07/25/2016, which is now available for review.
Mass is slightly enlarged over time, measuring 13.8 x 8.1 cm on
prior examination when measured similarly. Differential
considerations remain benign lipoma versus low-grade liposarcoma,
which as previously noted cannot be reliably distinguished by
imaging.

*** End of Addendum ***
FINDINGS: Lower chest: No acute abnormality.  Small hiatal hernia.

Hepatobiliary: No solid liver abnormality is seen. No gallstones,
gallbladder wall thickening, or biliary dilatation.

Pancreas: Unremarkable. No pancreatic ductal dilatation or
surrounding inflammatory changes.

Spleen: Normal in size without significant abnormality.

Adrenals/Urinary Tract: Adrenal glands are unremarkable. Kidneys are
normal, without renal calculi, solid lesion, or hydronephrosis.
Bladder is unremarkable.

Stomach/Bowel: Stomach is within normal limits. Appendix appears
normal. No evidence of bowel wall thickening, distention, or
inflammatory changes. Sigmoid diverticulosis.

Vascular/Lymphatic: Aortic atherosclerosis. No enlarged abdominal or
pelvic lymph nodes.

Reproductive: No mass or other significant abnormality. Calcified
uterine fibroids.

Other: No abdominal wall hernia or abnormality. No abdominopelvic
ascites. No significant interval change in a large right fatty mass
in the right retroperitoneum, centered posterior to the duodenum and
pancreatic head, displacing the structures anteriorly. This mass
measures approximately 15.0 x 8.9 x cm.

Musculoskeletal: No acute or significant osseous findings.
IMPRESSION: 1. No significant interval change in a large right fatty mass in the
right retroperitoneum, centered posterior to the duodenum and
pancreatic head, displacing the structures anteriorly. This mass
measures approximately 15.0 x 8.9 x cm. Findings are most consistent
with a lipoma, although as previously noted low-grade liposarcoma
cannot be reliably distinguished from benign lipoma by imaging.
2. Sigmoid diverticulosis without evidence of diverticulitis.
3. Small hiatal hernia.
4. Aortic Atherosclerosis (HDLV6-QKO.O).

## 2022-02-16 ENCOUNTER — Encounter: Payer: Self-pay | Admitting: Cardiology

## 2022-02-28 ENCOUNTER — Telehealth: Payer: Self-pay | Admitting: Cardiovascular Disease

## 2022-02-28 NOTE — Telephone Encounter (Addendum)
Spoke with the patient. Adv her that her scheduled in office appt with Dr. Kirke Corin on 03/03/22 should be in person. Patient was last seen for an in person appt with Dr. Kirke Corin in 2021. Her last 2 appts with him have been virtual and telephone.  I offered to reschedule the appt for another day if that would be more convenient.  Patient sts that she will try to keep the appt as scheduled. Confirmed the appt date, time and location. She is her husbands caretaker and is concerned about transmitting something to her husband. She would prefer if everyone involved in her care that day wear a mask. Adv the patient that I will include that in the appt notes.

## 2022-02-28 NOTE — Telephone Encounter (Signed)
Pt would ike a callback regarding appt scheduled for Friday 03/03/22. Pt would like to know if appt is able to be over the phone or virtual being that she has to care for her husband. Please advise

## 2022-03-01 ENCOUNTER — Encounter: Payer: Self-pay | Admitting: *Deleted

## 2022-03-03 ENCOUNTER — Ambulatory Visit (INDEPENDENT_AMBULATORY_CARE_PROVIDER_SITE_OTHER): Payer: Medicare HMO | Admitting: Cardiovascular Disease

## 2022-03-03 ENCOUNTER — Encounter: Payer: Self-pay | Admitting: Cardiovascular Disease

## 2022-03-03 VITALS — BP 110/60 | HR 70 | Ht 68.0 in | Wt 305.2 lb

## 2022-03-03 DIAGNOSIS — I5022 Chronic systolic (congestive) heart failure: Secondary | ICD-10-CM

## 2022-03-03 DIAGNOSIS — I493 Ventricular premature depolarization: Secondary | ICD-10-CM | POA: Diagnosis not present

## 2022-03-03 DIAGNOSIS — I10 Essential (primary) hypertension: Secondary | ICD-10-CM | POA: Diagnosis not present

## 2022-03-03 MED ORDER — CARVEDILOL 12.5 MG PO TABS: 12.5000 mg | ORAL_TABLET | Freq: Two times a day (BID) | ORAL | 3 refills | Status: DC

## 2022-03-03 MED ORDER — FUROSEMIDE 40 MG PO TABS: 40.0000 mg | ORAL_TABLET | Freq: Every day | ORAL | 3 refills | Status: AC

## 2022-03-03 NOTE — Patient Instructions (Signed)
Medication Instructions:  - Your physician recommends that you continue on your current medications as directed. Please refer to the Current Medication list given to you today.  *If you need a refill on your cardiac medications before your next appointment, please call your pharmacy*   Lab Work: - none ordered  If you have labs (blood work) drawn today and your tests are completely normal, you will receive your results only by: MyChart Message (if you have MyChart) OR A paper copy in the mail If you have any lab test that is abnormal or we need to change your treatment, we will call you to review the results.   Testing/Procedures: - none ordered   Follow-Up: At [facility], you and your health needs are our priority.  As part of our continuing mission to provide you with exceptional heart care, we have created designated Provider Care Teams.  These Care Teams include your primary Cardiologist (physician) and Advanced Practice Providers (APPs -  Physician Assistants and Nurse Practitioners) who all work together to provide you with the care you need, when you need it.  We recommend signing up for the patient portal called "MyChart".  Sign up information is provided on this After Visit Summary.  MyChart is used to connect with patients for Virtual Visits (Telemedicine).  Patients are able to view lab/test results, encounter notes, upcoming appointments, etc.  Non-urgent messages can be sent to your provider as well.   To learn more about what you can do with MyChart, go to [email or web address].    Your next appointment:   6 month(s)  The format for your next appointment:   Virtual Visit   Provider:   You may see Lorine Bears, MD or one of the following Advanced Practice Providers on your designated Care Team:   Nicolasa Ducking, NP Eula Listen, PA-C Cadence Fransico Michael, [place]    Other Instructions N/a  Important Information About Sugar

## 2022-03-03 NOTE — Progress Notes (Signed)
Cardiology Office Note   Date:  03/03/2022   ID:  Penny Munoz, Penny Munoz 13-Jan-1951, MRN [id number]  PCP:  Leanna Sato, MD  Cardiologist:   Lorine Bears, MD   Chief Complaint  Patient presents with   Other    6 month f/u c/o sob with exertion. Meds reviewed verbally with pt.      History of Present Illness: Penny Munoz is a 71 y.o. female who is here today for follow-up visit regarding chronic systolic heart failure.  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 She moved to [place] around 2016. She quit smoking at least 5 years ago.   Most recent echocardiogram in December 2021 showed an EF of 35 to 40% with global hypokinesis, mild to moderate mitral regurgitation, mild aortic regurgitation and grade 1 diastolic dysfunction. She has been doing well with no chest pain.  She describes chronic exertional dyspnea which has been stable.  No significant leg edema.  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   Past Medical History:  Diagnosis Date   CHF (congestive heart failure) (HCC)    COPD (chronic obstructive pulmonary disease) (HCC)    Coronary artery disease    Hypertension     Past Surgical History:  Procedure Laterality Date   CARDIAC DEFIBRILLATOR PLACEMENT       Current Outpatient Medications  Medication Sig Dispense Refill   acetaminophen (TYLENOL) 325 MG tablet Take by mouth as needed.      albuterol (VENTOLIN HFA) 108 (90 Base) MCG/ACT inhaler Inhale into the lungs as needed.      allopurinol (ZYLOPRIM) 300 MG tablet Take 300 mg by mouth daily.     cetirizine (ZYRTEC) 10 MG tablet Take 10 mg by  mouth daily.     citalopram (CELEXA) 20 MG tablet Take 20 mg by mouth daily.     fluticasone (FLONASE) 50 MCG/ACT nasal spray Place 1 spray into both nostrils daily.     GARLIC PO Take by mouth daily.     HYDROcodone-acetaminophen (NORCO/VICODIN) 5-325 MG tablet Take 1 tablet by mouth every 6 (six) hours as needed. 15 tablet 0   Multiple Minerals (CALCIUM/MAGNESIUM/ZINC PO) Take by mouth. Takes 1 tablet every 3 days     Multiple Vitamins-Minerals (MULTIVITAMIN WITH MINERALS) tablet Take 1 tablet by mouth daily.     omeprazole (PRILOSEC) 20 MG capsule Take 20 mg by mouth daily.     sacubitril-valsartan (ENTRESTO) 24-26 MG Take 1 tablet by mouth 2 (two) times daily. 10 tablet 0   spironolactone (ALDACTONE) 25 MG tablet Take 1 tablet (25 mg total) by mouth daily. 30 tablet 0   VOLTAREN 1 % GEL Apply 1 a small amount to affected area four times a day     carvedilol (COREG) 12.5 MG tablet Take 1 tablet (12.5 mg total) by mouth 2 (two) times daily with a meal. 180 tablet 3   furosemide (LASIX) 40 MG tablet Take 1 tablet (40 mg total) by mouth daily. 90 tablet 3   No current facility-administered medications for this visit.    Allergies:   Hydrocodone, Oxycodone hcl, Ampicillin, Amoxicillin, and Penicillins    Social History:  The patient  reports that she has  never smoked. She has never used smokeless tobacco. She reports that she does not drink alcohol and does not use drugs.   Family History:  The patient's family history is not on file. She was adopted.  She does not know her family history   ROS:  Please see the history of present illness.   Otherwise, review of systems are positive for none.   All other systems are reviewed and negative.    PHYSICAL EXAM: VS:  BP 110/60 (BP Location: Left Arm, Patient Position: Sitting, Cuff Size: Large)   Pulse 70   Ht 5\' 8"  (1.727 m)   Wt (!) 305 lb 4 oz (138.5 kg)   SpO2 96%   BMI 46.41 kg/m  , BMI Body mass index is 46.41 kg/m. GEN: Well  nourished, well developed, in no acute distress  HEENT: normal  Neck: no JVD, carotid bruits, or masses Cardiac: RRR with premature beats; no murmurs, rubs, or gallops, mild bilateral leg edema Respiratory:  clear to auscultation bilaterally, normal work of breathing GI: soft, nontender, nondistended, + BS MS: no deformity or atrophy  Skin: warm and dry, no rash Neuro:  Strength and sensation are intact Psych: euthymic mood, full affect   EKG:  EKG is ordered today. The ekg ordered today demonstrates sinus rhythm with PVCs, right bundle branch block and left anterior fascicular block   Recent Labs: 07/13/2021: ALT 32; BUN 13; Hemoglobin 13.5; Platelets 234; Potassium 3.9; Sodium 134 08/16/2021: Creatinine, Ser 0.80    Lipid Panel No results found for: "CHOL", "TRIG", "HDL", "CHOLHDL", "VLDL", "LDLCALC", "LDLDIRECT"    Wt Readings from Last 3 Encounters:  03/03/22 (!) 305 lb 4 oz (138.5 kg)  05/17/21 300 lb (136.1 kg)  05/04/21 (!) 308 lb (139.7 kg)           07/15/2020    9:04 AM  PAD Screen  Previous PAD dx? No  Previous surgical procedure? No  Pain with walking? No  Feet/toe relief with dangling? No  Painful, non-healing ulcers? No  Extremities discolored? No      ASSESSMENT AND PLAN:   1.  Chronic systolic heart failure: Currently [place] heart association class III.  She has prolonged history of nonischemic cardiomyopathy since early 2000.  Most recent ejection fraction was 35 to 40%.  Continue carvedilol, Entresto and spironolactone.  I reviewed her most recent labs done in April which showed normal renal function and electrolytes. I do not think she is a good candidate for SGLT2 inhibitors given her morbid obesity and increased risk of infections.  2.  Status post ICD placement: Followed by Dr. May.  3.  Essential hypertension: Blood pressure is controlled on current medications.  4.  PVCs: Noted on EKG but she denies palpitations.  The burden seems  to be overall low.  Continue beta-blocker.  5.  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  This is followed by oncology.   Disposition:   FU in 6 months either office visit or virtual.  Signed,  2018, MD  03/03/2022 12:43 PM    [facility]

## 2022-03-20 ENCOUNTER — Telehealth: Payer: Self-pay | Admitting: Cardiovascular Disease

## 2022-03-20 NOTE — Telephone Encounter (Addendum)
Patient seen in July and was told to get pcp labs fwd for md review.  Scanned results to labs and fwd to nurse inbox.

## 2022-03-20 NOTE — Telephone Encounter (Signed)
Noted. Results fwd to Dr. Kirke Corin to review.

## 2022-05-04 ENCOUNTER — Telehealth: Payer: Self-pay | Admitting: Cardiovascular Disease

## 2022-05-04 MED ORDER — SPIRONOLACTONE 25 MG PO TABS: 25.0000 mg | ORAL_TABLET | Freq: Every day | ORAL | 1 refills | Status: DC

## 2022-05-04 NOTE — Telephone Encounter (Signed)
*  STAT* If patient is at the pharmacy, call can be transferred to refill team.   1. Which medications need to be refilled? (please list name of each medication and dose if known) prescription for Spironolactone  2. Which pharmacy/location (including street and city if local pharmacy) is medication to be sent to? [place]  3. Do they need a 30 day or 90 day supply?  90 days and refills

## 2022-05-16 NOTE — Progress Notes (Unsigned)
Electrophysiology Office Note:    Date:  05/17/2022   ID:  Penny Munoz, Penny Munoz 1951/07/16, MRN [id number]  PCP:  Marguerita Merles, MD  [facility] Cardiologist:  None  [facility] Electrophysiologist:  Vickie Epley, MD   Referring MD: Marguerita Merles, MD   Chief Complaint: ICD in situ  History of Present Illness:    Penny Munoz is a 71 y.o. female who presents for an evaluation of ICD at the request of Dr. Fletcher Anon. Their medical history includes COPD, chronic systolic heart failure, CAD, hypertension.  The patient was seen by Dr. Fletcher Anon August 26, 2020.  Her ICD was implanted in 2004 at [facility].  At the appointment with Dr. Fletcher Anon, she had NYHA class III symptoms.  Her last ejection fraction was 35 to 40% at the time of that appointment.  I last saw the patient in May 04, 2021 to establish care in our device clinic.  We planned for 1 year follow-up.  Since our last appointment, she has done well.  She has significant anxiety about her fibrillator and worries that someone will "be able to kill her" using the device.  She has not activated remote monitoring for that reason.     Past Medical History:  Diagnosis Date   CHF (congestive heart failure) (HCC)    COPD (chronic obstructive pulmonary disease) (HCC)    Coronary artery disease    Hypertension     Past Surgical History:  Procedure Laterality Date   CARDIAC DEFIBRILLATOR PLACEMENT      Current Medications: Current Meds  Medication Sig   acetaminophen (TYLENOL) 325 MG tablet Take by mouth as needed.    albuterol (VENTOLIN HFA) 108 (90 Base) MCG/ACT inhaler Inhale into the lungs as needed.    allopurinol (ZYLOPRIM) 300 MG tablet Take 300 mg by mouth daily.   carvedilol (COREG) 12.5 MG tablet Take 1 tablet (12.5 mg total) by mouth 2 (two) times daily with a meal.   cetirizine (ZYRTEC) 10 MG tablet Take 10 mg by mouth daily.   citalopram (CELEXA) 20 MG tablet Take 20 mg by  mouth daily.   fluticasone (FLONASE) 50 MCG/ACT nasal spray Place 1 spray into both nostrils daily.   furosemide (LASIX) 40 MG tablet Take 1 tablet (40 mg total) by mouth daily.   GARLIC PO Take by mouth daily.   HYDROcodone-acetaminophen (NORCO/VICODIN) 5-325 MG tablet Take 1 tablet by mouth every 6 (six) hours as needed.   Multiple Minerals (CALCIUM/MAGNESIUM/ZINC PO) Take by mouth. Takes 1 tablet every 3 days   Multiple Vitamins-Minerals (MULTIVITAMIN WITH MINERALS) tablet Take 1 tablet by mouth daily.   omeprazole (PRILOSEC) 20 MG capsule Take 20 mg by mouth daily.   sacubitril-valsartan (ENTRESTO) 24-26 MG Take 1 tablet by mouth 2 (two) times daily.   spironolactone (ALDACTONE) 25 MG tablet Take 1 tablet (25 mg total) by mouth daily.   VOLTAREN 1 % GEL Apply 1 a small amount to affected area four times a day     Allergies:   Hydrocodone, Oxycodone hcl, Ampicillin, Amoxicillin, and Penicillins   Social History   Socioeconomic History   Marital status: Married    Spouse name: Not on file   Number of children: Not on file   Years of education: Not on file   Highest education level: Not on file  Occupational History   Not on file  Tobacco Use   Smoking status: Never   Smokeless tobacco: Never  Vaping Use  Vaping Use: Never used  Substance and Sexual Activity   Alcohol use: No   Drug use: No   Sexual activity: Not on file  Other Topics Concern   Not on file  Social History Narrative   Not on file   Social Determinants of Health   Financial Resource Strain: Not on file  Food Insecurity: Not on file  Transportation Needs: Not on file  Physical Activity: Not on file  Stress: Not on file  Social Connections: Not on file     Family History: The patient's family history is not on file. She was adopted.  ROS:   Please see the history of present illness.    All other systems reviewed and are negative.  EKGs/Labs/Other Studies Reviewed:    The following studies were  reviewed today:  August 05, 2020 echo personally reviewed Left ventricular function moderately reduced, 35% Right ventricular function low normal Mild to moderate MR Mild AI  May 04, 2021 in clinic device interrogation personally reviewed No ICD therapies have not delivered Ventricular sensing 99.8% VVI 40 Lead parameter stable Remaining longevity 3.7 years  May 17, 2022 in clinic device interrogation personally reviewed Longevity 3.4 years Lead parameters stable Ventricular sensing 99.8% No high-voltage therapies 1 monitored NSVT lasting 1 second  EKG:  The ekg ordered today demonstrates sinus rhythm.  Frequent PVCs.  Right bundle branch block.  Left anterior fascicular block.  Recent Labs: 07/13/2021: ALT 32; BUN 13; Hemoglobin 13.5; Platelets 234; Potassium 3.9; Sodium 134 08/16/2021: Creatinine, Ser 0.80  Recent Lipid Panel No results found for: "CHOL", "TRIG", "HDL", "CHOLHDL", "VLDL", "LDLCALC", "LDLDIRECT"  Physical Exam:    VS:  BP 128/88   Pulse 73   Ht 5\' 8"  (1.727 m)   Wt (!) 306 lb (138.8 kg)   SpO2 93%   BMI 46.53 kg/m     Wt Readings from Last 3 Encounters:  05/17/22 (!) 306 lb (138.8 kg)  03/03/22 (!) 305 lb 4 oz (138.5 kg)  05/17/21 300 lb (136.1 kg)     GEN:  Well nourished, well developed in no acute distress.  Morbidly obese HEENT: Normal NECK: No JVD; No carotid bruits LYMPHATICS: No lymphadenopathy CARDIAC: RRR, no murmurs, rubs, gallops.  ICD pocket well-healed RESPIRATORY:  Clear to auscultation without rales, wheezing or rhonchi  ABDOMEN: Soft, non-tender, non-distended MUSCULOSKELETAL:  No edema; No deformity  SKIN: Warm and dry NEUROLOGIC:  Alert and oriented x 3 PSYCHIATRIC:  Normal affect   ASSESSMENT:    1. Chronic systolic heart failure (HCC)   2. ICD (implantable cardioverter-defibrillator) in place     PLAN:    In order of problems listed above:  #Chronic systolic heart failure Followed by Dr.  07/17/21 Continue therapy including Coreg, Lasix, Entresto, spironolactone  #ICD in situ Device functioning appropriately Continue remote monitoring.  We have gone through the process of remote monitoring today.  We did a lot of education during today's visit about remote monitoring and device capabilities.  We also discussed the security of defibrillators in the data supporting remote monitoring.  She is willing to start remote monitoring with her device.   Follow-up with APP in 1 year.   Medication Adjustments/Labs and Tests Ordered: Current medicines are reviewed at length with the patient today.  Concerns regarding medicines are outlined above.  No orders of the defined types were placed in this encounter.   No orders of the defined types were placed in this encounter.     Signed, Kirke Corin. Rossie Muskrat,  MD, [facility], [facility] 05/17/2022 9:44 AM    Electrophysiology [facility]

## 2022-05-17 ENCOUNTER — Ambulatory Visit: Payer: Medicare HMO | Attending: Cardiology | Admitting: Cardiology

## 2022-05-17 ENCOUNTER — Ambulatory Visit (INDEPENDENT_AMBULATORY_CARE_PROVIDER_SITE_OTHER): Payer: Medicare HMO

## 2022-05-17 ENCOUNTER — Encounter: Payer: Self-pay | Admitting: Cardiology

## 2022-05-17 VITALS — BP 128/88 | HR 73 | Ht 68.0 in | Wt 306.0 lb

## 2022-05-17 DIAGNOSIS — Z95810 Presence of automatic (implantable) cardiac defibrillator: Secondary | ICD-10-CM | POA: Diagnosis not present

## 2022-05-17 DIAGNOSIS — I428 Other cardiomyopathies: Secondary | ICD-10-CM

## 2022-05-17 DIAGNOSIS — I5022 Chronic systolic (congestive) heart failure: Secondary | ICD-10-CM | POA: Diagnosis not present

## 2022-05-17 LAB — CUP PACEART REMOTE DEVICE CHECK
Battery Remaining Longevity: 41 mo
Battery Voltage: 2.9 V
Brady Statistic RV Percent Paced: 0.16 %
Date Time Interrogation Session: 20231004095507
HighPow Impedance: 91 Ohm
Implantable Lead Implant Date: 20151002
Implantable Lead Location: 753860
Implantable Pulse Generator Implant Date: 20151002
Lead Channel Impedance Value: 399 Ohm
Lead Channel Impedance Value: 494 Ohm
Lead Channel Pacing Threshold Amplitude: 0.5 V
Lead Channel Pacing Threshold Pulse Width: 0.4 ms
Lead Channel Sensing Intrinsic Amplitude: 5.375 mV
Lead Channel Sensing Intrinsic Amplitude: 8.25 mV
Lead Channel Setting Pacing Amplitude: 2 V
Lead Channel Setting Pacing Pulse Width: 0.4 ms
Lead Channel Setting Sensing Sensitivity: 0.3 mV

## 2022-05-17 NOTE — Patient Instructions (Signed)
Medication Instructions:  No changes *If you need a refill on your cardiac medications before your next appointment, please call your pharmacy*   Lab Work: none If you have labs (blood work) drawn today and your tests are completely normal, you will receive your results only by: MyChart Message (if you have MyChart) OR A paper copy in the mail If you have any lab test that is abnormal or we need to change your treatment, we will call you to review the results.   Testing/Procedures: none   Follow-Up: At [facility], you and your health needs are our priority.  As part of our continuing mission to provide you with exceptional heart care, we have created designated Provider Care Teams.  These Care Teams include your primary Cardiologist (physician) and Advanced Practice Providers (APPs -  Physician Assistants and Nurse Practitioners) who all work together to provide you with the care you need, when you need it.  We recommend signing up for the patient portal called "MyChart".  Sign up information is provided on this After Visit Summary.  MyChart is used to connect with patients for Virtual Visits (Telemedicine).  Patients are able to view lab/test results, encounter notes, upcoming appointments, etc.  Non-urgent messages can be sent to your provider as well.   To learn more about what you can do with MyChart, go to [email or web address].    Your next appointment:   1 year(s)  The format for your next appointment:   In Person  Provider:   You will see one of the following Advanced Practice Providers on your designated Care Team:   Christopher Berge, NP Ryan Dunn, PA-C Cadence Furth, PA-C Sheri Hammock, NP      Other Instructions None   Important Information About Sugar       

## 2022-05-19 LAB — CUP PACEART INCLINIC DEVICE CHECK
Battery Remaining Longevity: 41 mo
Battery Voltage: 2.9 V
Brady Statistic RV Percent Paced: 0.16 %
Date Time Interrogation Session: 20231004101658
HighPow Impedance: 91 Ohm
Implantable Lead Implant Date: 20151002
Implantable Lead Location: 753860
Implantable Pulse Generator Implant Date: 20151002
Lead Channel Impedance Value: 399 Ohm
Lead Channel Impedance Value: 494 Ohm
Lead Channel Pacing Threshold Amplitude: 0.5 V
Lead Channel Pacing Threshold Pulse Width: 0.4 ms
Lead Channel Sensing Intrinsic Amplitude: 5.375 mV
Lead Channel Sensing Intrinsic Amplitude: 8.25 mV
Lead Channel Setting Pacing Amplitude: 2 V
Lead Channel Setting Pacing Pulse Width: 0.4 ms
Lead Channel Setting Sensing Sensitivity: 0.3 mV

## 2022-05-26 NOTE — Progress Notes (Signed)
Remote ICD transmission.   

## 2022-08-09 ENCOUNTER — Other Ambulatory Visit: Payer: Self-pay

## 2022-08-09 MED ORDER — ENTRESTO 24-26 MG PO TABS: 1.0000 | ORAL_TABLET | Freq: Two times a day (BID) | ORAL | 0 refills | Status: DC

## 2022-09-25 ENCOUNTER — Telehealth: Payer: Self-pay | Admitting: Cardiology

## 2022-09-25 NOTE — Telephone Encounter (Signed)
Attempted to call patient back but was unable to reach or leave voicemail as mailbox not set up to receive messages

## 2022-09-25 NOTE — Telephone Encounter (Signed)
Pt c/o medication issue:  1. Name of Medication: sacubitril-valsartan (ENTRESTO) 24-26 MG   2. How are you currently taking this medication (dosage and times per day)?   3. Are you having a reaction (difficulty breathing--STAT)?   4. What is your medication issue? Patient states she would like a call back to discuss this medication and financial assistance.

## 2022-09-25 NOTE — Telephone Encounter (Signed)
Spoke with patient and informed her that the application was filled out  with as much information possible that we could address. Informed patient that she would have to come in and bring proof of income (most recent tax return) and to sign it so we could have the provider sign it. Patient seemed to be uncertain that she had to come in and bring proof of income and asked for the person that filled it out for her previously. Informed patient I did not know who that person was but that she was able to come to the office, bring the information requested and to sign it so we could get the application sent off for her to [employer].

## 2022-09-27 ENCOUNTER — Telehealth: Payer: Self-pay | Admitting: Cardiovascular Disease

## 2022-09-27 NOTE — Telephone Encounter (Signed)
Duplicate see other note

## 2022-09-27 NOTE — Telephone Encounter (Signed)
Pt c/o medication issue:  1. Name of Medication:   sacubitril-valsartan (ENTRESTO) 24-26 MG    2. How are you currently taking this medication (dosage and times per day)?     3. Are you having a reaction (difficulty breathing--STAT)? no  4. What is your medication issue?   Patient is calling in regards to her medication. Please advise

## 2022-09-27 NOTE — Telephone Encounter (Signed)
The patient was calling to ask for assistance in filling out her Entresto assistance papers. She has been advised that we will fill out as much as we can and then she will need to do her portion. She has verbalized her understanding.

## 2022-09-28 MED ORDER — ENTRESTO 24-26 MG PO TABS: 1.0000 | ORAL_TABLET | Freq: Two times a day (BID) | ORAL | 3 refills | Status: DC

## 2022-09-28 NOTE — Telephone Encounter (Signed)
Medication Samples have been provided to the patient.  Drug name: Delene Loll       Strength: 24-27m        Qty: one bottle  LOT: [id number] Exp.Date: 02/2024  Patient has been made aware to fill out her portion and bring it back, then we can fax it all for her.

## 2022-09-29 NOTE — Telephone Encounter (Signed)
Pt's grandson brought in signed form to be added to the previously obtained packet. Form placed in nurse Lisa Richardson's box.

## 2022-09-29 NOTE — Telephone Encounter (Addendum)
Assistance for Penny Munoz has been faxed.

## 2022-10-09 NOTE — Telephone Encounter (Signed)
The patient called in stating that her grandson had filled out her assistance papers and put 2 as the household size when it should be one. The patient will have this changed and it re-faxed to [employer].

## 2022-10-20 NOTE — Telephone Encounter (Signed)
Entresto assistance approved through 12/31/204

## 2023-01-25 ENCOUNTER — Other Ambulatory Visit: Payer: Self-pay | Admitting: Cardiovascular Disease

## 2023-01-25 NOTE — Telephone Encounter (Signed)
Please schedule F/U appointment with Dr. Arida. Thank you! 

## 2023-01-30 NOTE — Telephone Encounter (Signed)
Please see below.

## 2023-02-12 NOTE — Telephone Encounter (Signed)
No voicemail to leave msg to schedule, will try back at a later time

## 2023-03-30 ENCOUNTER — Encounter: Payer: Self-pay | Admitting: Cardiology

## 2023-03-30 ENCOUNTER — Ambulatory Visit: Payer: Medicare HMO | Attending: Cardiology | Admitting: Cardiology

## 2023-03-30 VITALS — BP 120/76 | HR 67 | Ht 68.0 in | Wt 319.0 lb

## 2023-03-30 DIAGNOSIS — G4733 Obstructive sleep apnea (adult) (pediatric): Secondary | ICD-10-CM

## 2023-03-30 DIAGNOSIS — I10 Essential (primary) hypertension: Secondary | ICD-10-CM | POA: Diagnosis not present

## 2023-03-30 DIAGNOSIS — I5022 Chronic systolic (congestive) heart failure: Secondary | ICD-10-CM | POA: Diagnosis not present

## 2023-03-30 DIAGNOSIS — Z95810 Presence of automatic (implantable) cardiac defibrillator: Secondary | ICD-10-CM | POA: Diagnosis not present

## 2023-03-30 DIAGNOSIS — I428 Other cardiomyopathies: Secondary | ICD-10-CM | POA: Diagnosis not present

## 2023-03-30 DIAGNOSIS — I452 Bifascicular block: Secondary | ICD-10-CM

## 2023-03-30 DIAGNOSIS — I340 Nonrheumatic mitral (valve) insufficiency: Secondary | ICD-10-CM

## 2023-03-30 DIAGNOSIS — R0602 Shortness of breath: Secondary | ICD-10-CM

## 2023-03-30 NOTE — Progress Notes (Addendum)
Cardiology Office Note:  .   Date:  03/30/2023  ID:  Penny Munoz, DOB July 10, 1951, MRN [id number] PCP: Leanna Sato, MD  [facility] Providers Cardiologist:  Lorine Bears, MD Electrophysiologist:  Lanier Prude, MD    History of Present Illness: .   Penny Munoz is a 72 y.o. female with a past medical history HFrEF, nonischemic cardiomyopathy, status post ICD(2004) hypertension, retroperitoneal mass, nonobstructive coronary artery disease(2015), who is here today for follow-up.  ICD placement 2004 at the [facility] [place].  Last heart catheterization in 12/2013.Echo in 2021 showed EF 35-40% with global hypokinesis, mild to moderate MR, mild aortic regurgitation, and G1DD.  She was seen by Dr. Kirke Corin on 03/03/2022.  At that time she continued to have shortness of breath with exertion.  There were no medication changes that was ordered at that time.She also was seen by EP Dr Lalla Brothers on 05/17/2022.  Her device was functioning well. There was a long discussion that was had about remote monitoring and the patient had finally agreed. There were no medication changes that were made and no testing that was ordered.   She returns to clinic today with complaints of shortness of breath and peripheral edema.  She is in a wheelchair today.  She states that she continues to suffer from fatigue as well.  She states that her husband has cancer and has had multiple strokes and she is primary caregiver.  She was last seen in clinic in October and had her ICD interrogated.  She states that she just recently plugged up her device for remote monitoring.  She states that she has been compliant with her medications and also denies any hospitalizations or visits to the emergency department.  ROS: 10 point review of systems has been reviewed and considered negative with exception of what is been listed in the HPI  Studies Reviewed: Marland Kitchen   EKG Interpretation Date/Time:  Friday March 30 2023 13:27:11 EDT Ventricular Rate:  67 PR Interval:  176 QRS Duration:  158 QT Interval:  480 QTC Calculation: 507 R Axis:   -70  Text Interpretation: Sinus rhythm with Fusion complexes Right bundle branch block Left anterior fascicular block Bifascicular block No previous ECGs available Confirmed by Charlsie Quest ([id number]) on 03/30/2023 1:33:11 PM   TTE 08/05/2020 1. Left ventricular ejection fraction, by estimation, is 35 to 40%. The  left ventricle has moderately decreased function. The left ventricle  demonstrates global hypokinesis. The left ventricular internal cavity size  was mildly to moderately dilated.  There is mild left ventricular hypertrophy. Left ventricular diastolic  parameters are consistent with Grade I diastolic dysfunction (impaired  relaxation).   2. Right ventricular systolic function is low normal. The right  ventricular size is normal.   3. The mitral valve is grossly normal. Mild to moderate mitral valve  regurgitation.   4. The aortic valve is grossly normal. Aortic valve regurgitation is  mild.   5. The inferior vena cava is normal in size with greater than 50%  respiratory variability, suggesting right atrial pressure of 3 mmHg.  Risk Assessment/Calculations:             Physical Exam:   VS:  BP 120/76 (BP Location: Right Wrist, Patient Position: Sitting, Cuff Size: Normal)   Pulse 67   Ht 5\' 8"  (1.727 m)   Wt (!) 319 lb (144.7 kg)   SpO2 92%   BMI 48.50 kg/m    Wt Readings from Last 3  Encounters:  03/30/23 (!) 319 lb (144.7 kg)  05/17/22 (!) 306 lb (138.8 kg)  03/03/22 (!) 305 lb 4 oz (138.5 kg)    GEN: Well nourished, well developed in no acute distress NECK: No JVD; No carotid bruits CARDIAC: RRR, no murmurs, rubs, gallops RESPIRATORY:  Clear with diminished bases,to auscultation without rales, wheezing or rhonchi  ABDOMEN: Soft, non-tender, non-distended EXTREMITIES:  1+ edema to BLE; No deformity   ASSESSMENT AND PLAN: .    Shortness of breath with peripheral edema and weight gain noted since her last appointment.  Patient states that she has continued to take her current medications.  Discussed ordering an echocardiogram as she previously was noted to have mild to moderate MR, patient is only amenable if the procedure can be done prior to the start of flu season because after that she states she is not coming back into the house until the spring.  We are requesting her most recent labs from her PCP and she is continued on furosemide 40 mg daily.  Chronic HFrEF/nonischemic cardiomyopathy her volume status is difficult to determine due to body habitus.  She is NYHA II symptoms.  Recommended repeat echocardiogram as her last study was done in 2021 patient not agreeable to be done in the next couple of days.  She has been continued on carvedilol, furosemide, Entresto, and spironolactone.  Do not feel as though she is a good candidate for an SGLT2 inhibitor due to her body size and limited activity and increased risk for infections.  Encouraged to weigh daily, decrease sodium intake, and limit fluid intake.  Primary hypertension with blood pressure today 120/76.  Blood pressure continues to remain stable.  She is continued on current medication without changes.  States that she requires no refills at this time.  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  She continues to be followed by oncology.  Status post ICD placement followed by Dr. Lalla Brothers with EP.  Last device interrogation was completed 05/2022.  Patient stated she had just recently plugged in her remote device will reach out to device clinic to see if they can upload any information.  She has been advised that she needs a scheduled EP appointment.  Patient is declining appointment at this time.  Bifascicular block noted on EKG with a right bundle branch block and a left anterior fascicular block that are both chronic.  Will continue  to monitor with surveillance EKGs       Dispo: Patient to return to clinic to see MD/APP in 6 months or sooner if needed  Signed, Rubby Barbary, NP

## 2023-03-30 NOTE — Patient Instructions (Signed)
Medication Instructions:  Your physician recommends that you continue on your current medications as directed. Please refer to the Current Medication list given to you today.   *If you need a refill on your cardiac medications before your next appointment, please call your pharmacy*   Lab Work: No labs ordered today    Testing/Procedures: Your physician has requested that you have an echocardiogram. Echocardiography is a painless test that uses sound waves to create images of your heart. It provides your doctor with information about the size and shape of your heart and how well your heart's chambers and valves are working.   You may receive an ultrasound enhancing agent through an IV if needed to better visualize your heart during the echo. This procedure takes approximately one hour.  There are no restrictions for this procedure.  This will take place at [address], [place] [address]    Follow-Up: At [facility] you and your health needs are our priority.  As part of our continuing mission to provide you with exceptional heart care, we have created designated Provider Care Teams.  These Care Teams include your primary Cardiologist (physician) and Advanced Practice Providers (APPs -  Physician Assistants and Nurse Practitioners) who all work together to provide you with the care you need, when you need it.  We recommend signing up for the patient portal called "MyChart".  Sign up information is provided on this After Visit Summary.  MyChart is used to connect with patients for Virtual Visits (Telemedicine).  Patients are able to view lab/test results, encounter notes, upcoming appointments, etc.  Non-urgent messages can be sent to your provider as well.   To learn more about what you can do with MyChart, go to [email or web address].    Your next appointment:   6 month(s)  Provider:    Lorine Bears, MD

## 2023-04-10 IMAGING — CT CT ABD-PELV W/ CM
2 of 5 series · 16 of 46 positions shown, 18 images · IV contrast (omnipaque)
Comparison: CT abdomen and pelvis 06/23/2020

CLINICAL DATA: Retroperitoneal mass follow-up

EXAM:
CT ABDOMEN AND PELVIS WITH CONTRAST
TECHNIQUE: Multidetector CT imaging of the abdomen and pelvis was performed
using the standard protocol following bolus administration of
intravenous contrast.
CONTRAST:  100mL OMNIPAQUE IOHEXOL 350 MG/ML SOLN

[Series 2: abd pelvis 5.00 · axial · 0.80mm/px · z∈[-1516,-1071]mm · 13 of 101 slices shown, 15 images]
[im 6/101  soft-tissue]
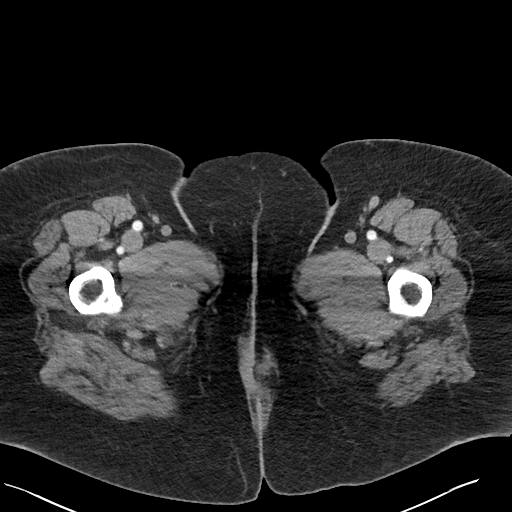
[im 6/101  bone]
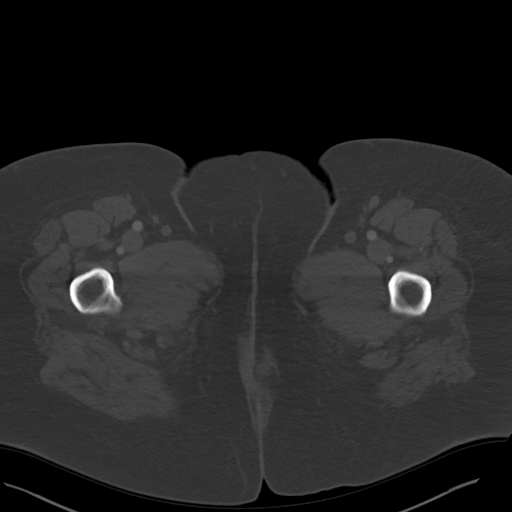
[im 12/101  soft-tissue]
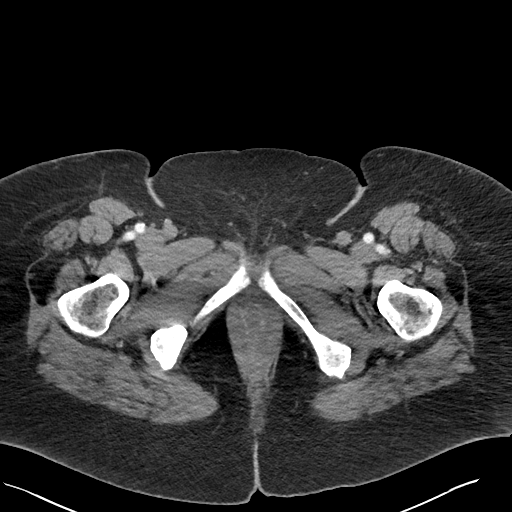
[im 24/101  soft-tissue]
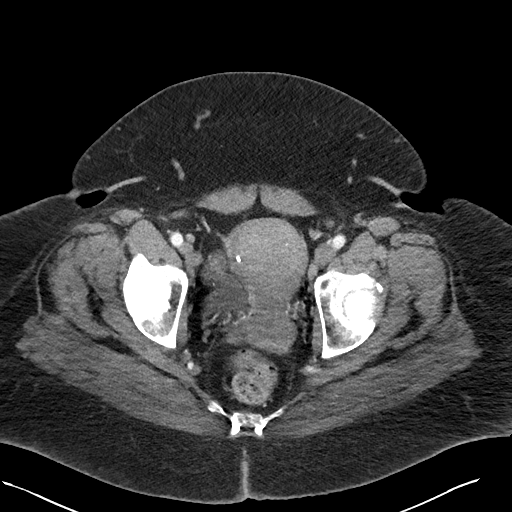
[im 30/101  soft-tissue]
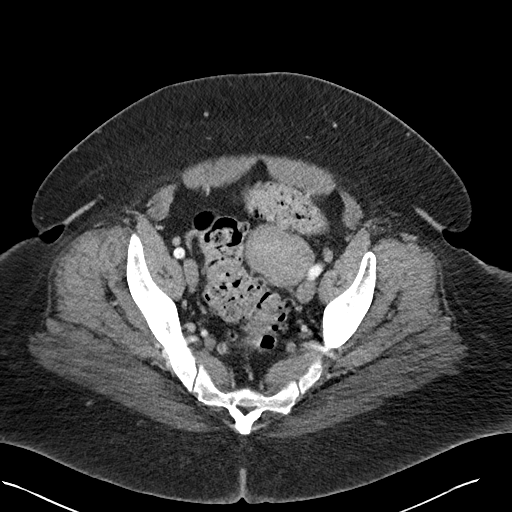
[im 36/101  soft-tissue]
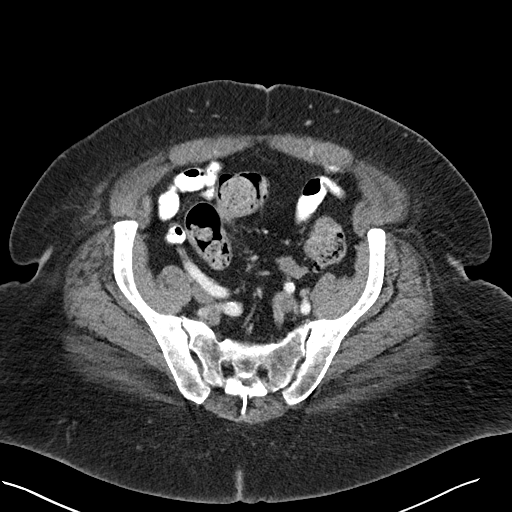
[im 42/101  soft-tissue]
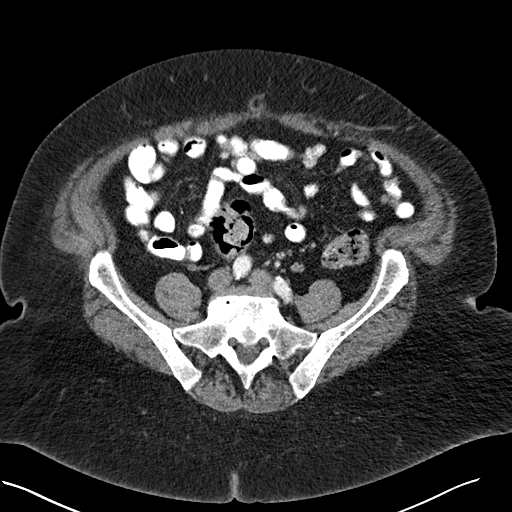
[im 53/101  soft-tissue]
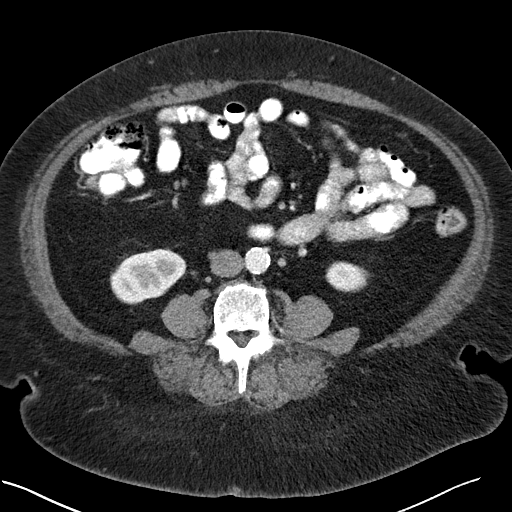
[im 59/101  soft-tissue]
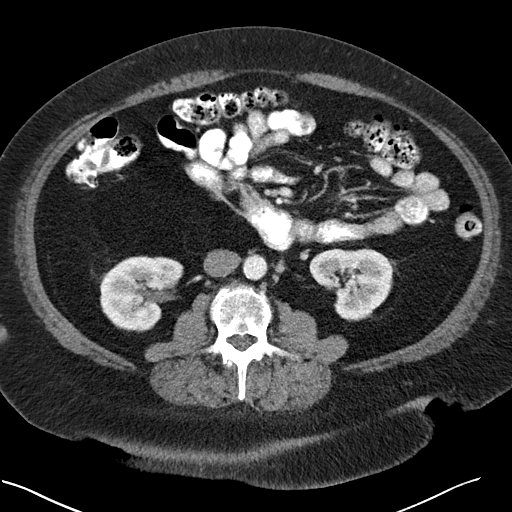
[im 65/101  soft-tissue]
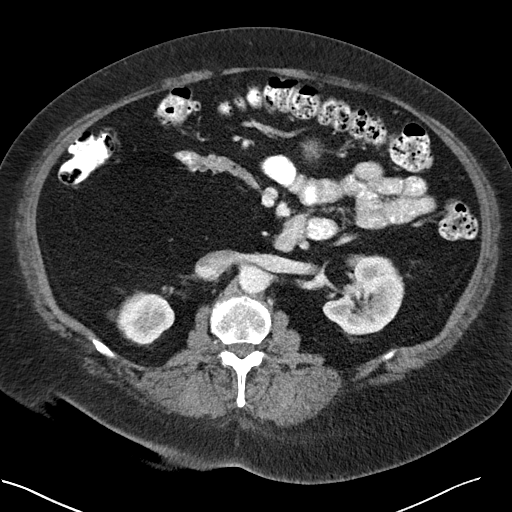
[im 65/101  bone]
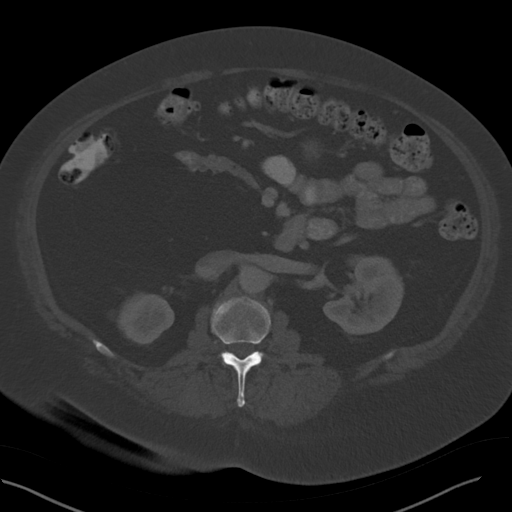
[im 71/101  soft-tissue]
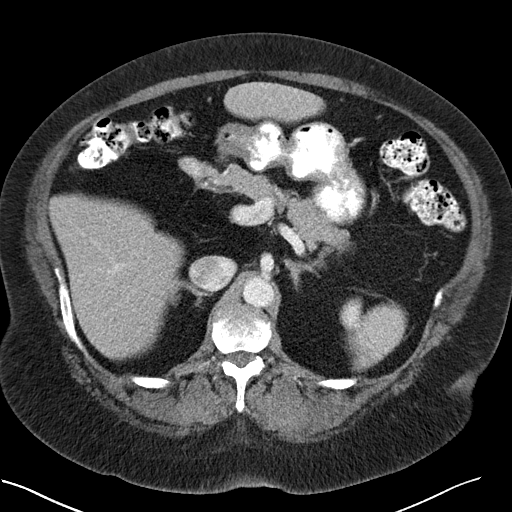
[im 77/101  soft-tissue]
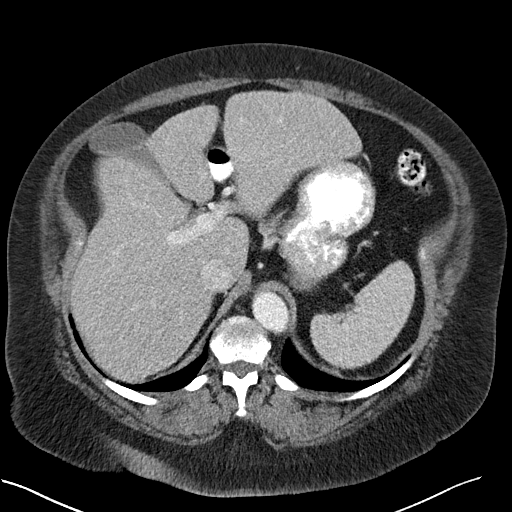
[im 89/101  soft-tissue]
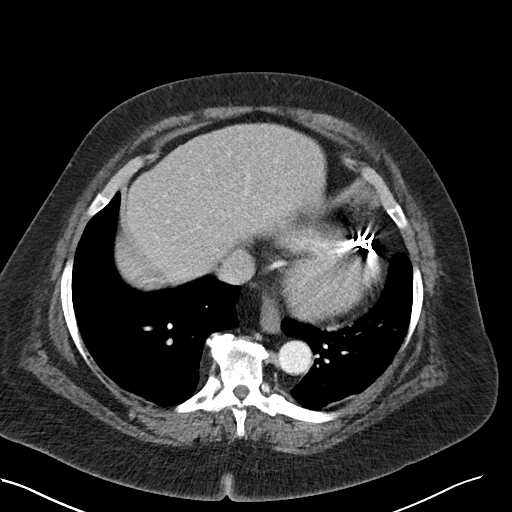
[im 95/101  soft-tissue]
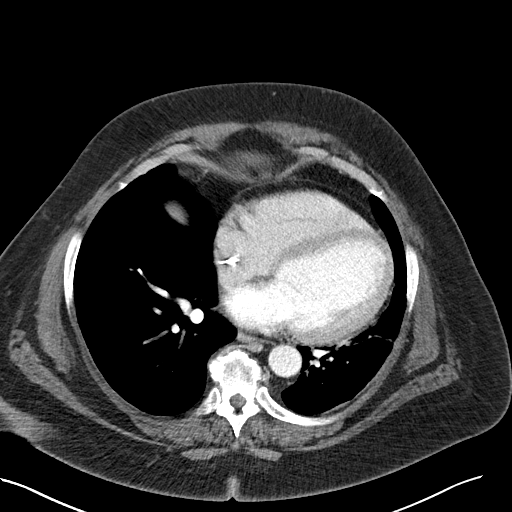

[Series 4: coronals abd pelvis 2.00 cor · coronal · 0.80mm/px · 3 of 191 slices shown]
[im 64/191  soft-tissue]
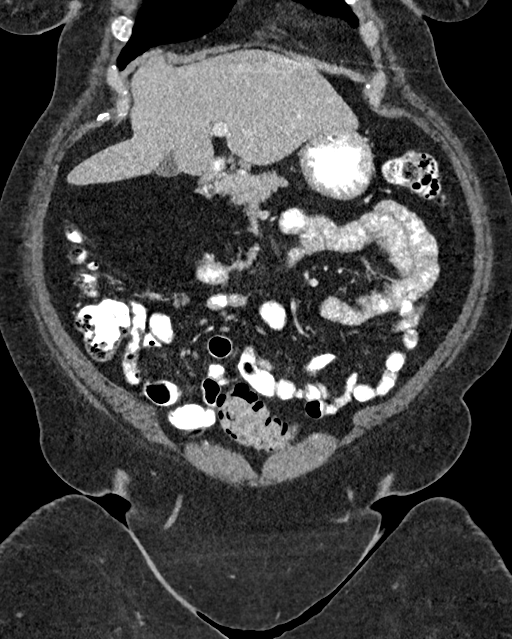
[im 85/191  soft-tissue]
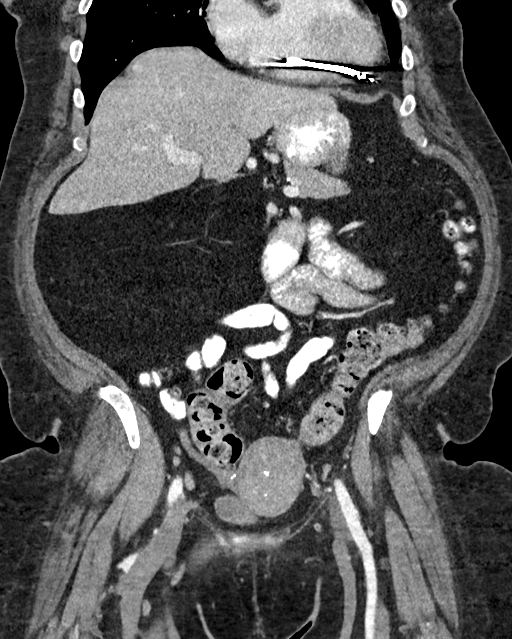
[im 106/191  soft-tissue]
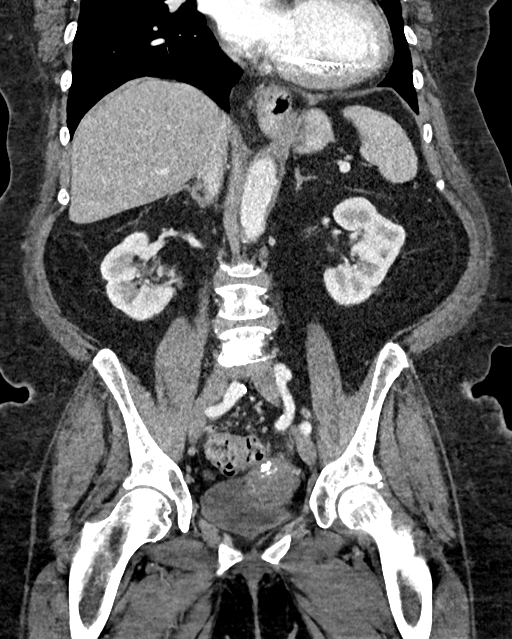

[16 of 46 positions shown; findings below may reference images not displayed]

FINDINGS: Lower chest: No acute abnormality.

Hepatobiliary: Liver is normal in size and contour with no
suspicious mass identified. Small calcified stone in the dependent
gallbladder. No biliary ductal dilatation.

Pancreas: No suspicious mass or ductal dilatation identified. Head
of the pancreas is again displaced anteriorly secondary to the fatty
mass.

Spleen: Normal in size without focal abnormality.

Adrenals/Urinary Tract: Adrenal glands appear normal. 1 cm hypodense
cyst in the upper pole the right kidney no hydronephrosis. Urinary
bladder appears grossly normal.

Stomach/Bowel: Small hiatal hernia. No bowel obstruction, free air
or pneumatosis. Colonic diverticulosis without evidence of acute
diverticulitis. Moderate-to-large amount of retained fecal material
in the colon. Appendix is normal.

Vascular/Lymphatic: Aortic atherosclerosis. No enlarged abdominal or
pelvic lymph nodes.

Reproductive: Small calcified uterine fibroids. No suspicious
adnexal mass identified.

Other: Redemonstration of the fat density mass in the right
retroperitoneum centered posterior to the second portion of the
duodenum and pancreatic head which measures 16 x 9 cm, very similar
to previous study. No definite new solid nodular components
visualized within the mass area.

Musculoskeletal: Degenerative changes of the lumbar spine. No
suspicious bony lesions identified.
IMPRESSION: 1. No significant change in size or appearance of the fatty right
retroperitoneal mass measuring 16 x 9 cm. Again most consistent with
a lipoma, although as previously noted a low-grade liposarcoma can
not be reliably distinguished from lipoma by imaging.
2. Colonic diverticulosis.
3. Cholelithiasis.
4. Small hiatal hernia.
5. Other chronic findings as described.

## 2023-04-25 ENCOUNTER — Ambulatory Visit: Payer: Medicare HMO | Attending: Cardiology

## 2023-04-25 DIAGNOSIS — I340 Nonrheumatic mitral (valve) insufficiency: Secondary | ICD-10-CM

## 2023-04-25 DIAGNOSIS — R0602 Shortness of breath: Secondary | ICD-10-CM

## 2023-04-25 LAB — ECHOCARDIOGRAM COMPLETE
Area-P 1/2: 2.95 cm2
S' Lateral: 4.1 cm

## 2023-04-25 NOTE — Progress Notes (Signed)
Heart squeeze has remained same as previous study at 35-40%, there is mild stiffening noted in the muscle which is likely related to age and or hypertension, there is mild leakage in the mitral valve, there is mild to moderate leakage noted in the the tricuspid valve and mild leakage noted in the aortic valve.  No significant change in repeat study to account for symptoms of shortness of breath.

## 2023-10-22 ENCOUNTER — Telehealth: Payer: Self-pay | Admitting: Cardiovascular Disease

## 2023-10-22 ENCOUNTER — Other Ambulatory Visit: Payer: Self-pay

## 2023-10-22 MED ORDER — SACUBITRIL-VALSARTAN 24-26 MG PO TABS: 1.0000 | ORAL_TABLET | Freq: Two times a day (BID) | ORAL | 0 refills | Status: DC

## 2023-10-22 MED ORDER — SPIRONOLACTONE 25 MG PO TABS: 25.0000 mg | ORAL_TABLET | Freq: Every day | ORAL | 0 refills | Status: DC

## 2023-10-22 MED ORDER — CARVEDILOL 12.5 MG PO TABS: 12.5000 mg | ORAL_TABLET | Freq: Two times a day (BID) | ORAL | 0 refills | Status: DC

## 2023-10-22 NOTE — Telephone Encounter (Signed)
*  STAT* If patient is at the pharmacy, call can be transferred to refill team.   1. Which medications need to be refilled? (please list name of each medication and dose if known) carvedilol (COREG) 12.5 MG tablet   sacubitril-valsartan (ENTRESTO) 24-26 MG   spironolactone (ALDACTONE) 25 MG tablet   2. Which pharmacy/location (including street and city if local pharmacy) is medication to be sent to? [facility] - [place], [place] - [address]   3. Do they need a 30 day or 90 day supply? 90   Patient has appt on 6/10

## 2023-10-25 ENCOUNTER — Other Ambulatory Visit: Payer: Self-pay

## 2023-10-25 MED ORDER — ENTRESTO 24-26 MG PO TABS: 1.0000 | ORAL_TABLET | Freq: Two times a day (BID) | ORAL | 0 refills | Status: DC

## 2023-10-31 ENCOUNTER — Telehealth: Payer: Self-pay | Admitting: Cardiovascular Disease

## 2023-10-31 ENCOUNTER — Other Ambulatory Visit (HOSPITAL_COMMUNITY): Payer: Self-pay

## 2023-10-31 ENCOUNTER — Telehealth: Payer: Self-pay

## 2023-10-31 MED ORDER — ENTRESTO 24-26 MG PO TABS
1.0000 | ORAL_TABLET | Freq: Two times a day (BID) | ORAL | 0 refills | Status: DC
  Filled 2023-10-31: qty 180, 90d supply, fill #0

## 2023-10-31 NOTE — Telephone Encounter (Signed)
 Pt c/o medication issue:  1. Name of Medication:   sacubitril-valsartan (ENTRESTO) 24-26 MG   2. How are you currently taking this medication (dosage and times per day)?   As prescribed  3. Are you having a reaction (difficulty breathing--STAT)?   No  4. What is your medication issue?    Patient stated she is almost out of this medication and wants to re-sign up for the program with [employer] as she cannot afford to pay for this medication.

## 2023-10-31 NOTE — Telephone Encounter (Signed)
 Per [facility] they do not fill Entresto and it's no longer being supplied at their pharmacy. Script will need to be filled elsewhere. Please send in prescription for Entresto to [facility]. Patient is setup for mail order with grant billing on file.

## 2023-10-31 NOTE — Telephone Encounter (Signed)
 Issue with [facility]. Touching base with their pharmacy team via telephone to resolve filling issue.

## 2023-10-31 NOTE — Telephone Encounter (Signed)
 RX sent in per note.

## 2023-10-31 NOTE — Telephone Encounter (Signed)
 Enrolled patient in [employer], see separate encounter for details

## 2023-10-31 NOTE — Telephone Encounter (Signed)
 Patient is calling back because she says [facility] the prescription. They need more information. Please advise

## 2023-10-31 NOTE — Telephone Encounter (Signed)
 Patient Advocate Encounter   The patient was approved for a [employer] grant that will help cover the cost of ENTRESTO Total amount awarded, $10,000.  Effective: 10/01/23 - 09/29/24   [id number] [id number]   Pharmacy provided with approval and processing information. Pharmacy will reach out to patient once script has been filled.   Haze Rushing, CPhT  Pharmacy Patient Advocate Specialist  Direct Number: [phone] Fax: [phone]

## 2023-10-31 NOTE — Telephone Encounter (Signed)
 Patient has been made aware of the following:  The patient was approved for a [employer] grant that will help cover the cost of ENTRESTO Total amount awarded, $10,000.  Effective: 10/01/23 - 09/29/24   [id number] [id number]   Pharmacy provided with approval and processing information. Pharmacy will reach out to patient once script has been filled.    Haze Rushing, CPhT  Pharmacy Patient Advocate Specialist  Direct Number: [phone] Fax: [phone]

## 2023-12-12 ENCOUNTER — Encounter: Payer: Medicare HMO | Admitting: Cardiology

## 2023-12-18 NOTE — Progress Notes (Unsigned)
  Electrophysiology Office Follow up Visit Note:    Date:  12/18/2023   ID:  Penny Munoz, DOB Jul 31, 1951, MRN [id number]  PCP:  Macie Saxon, MD  [facility] Cardiologist:  Antionette Kirks, MD  [facility] Electrophysiologist:  Boyce Byes, MD    Interval History:     Penny Munoz is a 73 y.o. female who presents for a follow up visit.   I last saw the patient May 17, 2022.  She has an ICD in situ.  She has declined remote monitoring in the past.        Past medical, surgical, social and family history were reviewed.  ROS:   Please see the history of present illness.    All other systems reviewed and are negative.  EKGs/Labs/Other Studies Reviewed:    The following studies were reviewed today:  Dec 19, 2023 in-clinic device interrogation personally reviewed ***        Physical Exam:    VS:  There were no vitals taken for this visit.    Wt Readings from Last 3 Encounters:  03/30/23 (!) 319 lb (144.7 kg)  05/17/22 (!) 306 lb (138.8 kg)  03/03/22 (!) 305 lb 4 oz (138.5 kg)     GEN: no distress CARD: RRR, No MRG.  CIED pocket well-healed RESP: No IWOB. CTAB.      ASSESSMENT:    No diagnosis found. PLAN:    In order of problems listed above:  #Chronic systolic heart failure NYHA class II.  Follows with Dr. Alvenia Aus.  Continue Coreg , Lasix , Entresto , spironolactone .  #ICD in situ Device functioning appropriately.  I have again discussed remote monitoring and the patient again declines.  Follow-up with APP in 1 year.   Signed, Harvie Liner, MD, [facility], [facility] 12/18/2023 2:31 PM    Electrophysiology [facility]

## 2023-12-19 ENCOUNTER — Ambulatory Visit: Payer: Self-pay | Attending: Cardiology | Admitting: Cardiology

## 2023-12-19 ENCOUNTER — Encounter: Payer: Self-pay | Admitting: Cardiology

## 2023-12-19 VITALS — BP 104/60 | HR 64 | Ht 68.0 in | Wt 305.0 lb

## 2023-12-19 DIAGNOSIS — I5022 Chronic systolic (congestive) heart failure: Secondary | ICD-10-CM | POA: Diagnosis not present

## 2023-12-19 DIAGNOSIS — Z95810 Presence of automatic (implantable) cardiac defibrillator: Secondary | ICD-10-CM | POA: Diagnosis not present

## 2023-12-19 NOTE — Patient Instructions (Signed)
 Medication Instructions:  Your physician recommends that you continue on your current medications as directed. Please refer to the Current Medication list given to you today.  *If you need a refill on your cardiac medications before your next appointment, please call your pharmacy*  Follow-Up: At [facility], you and your health needs are our priority.  As part of our continuing mission to provide you with exceptional heart care, our providers are all part of one team.  This team includes your primary Cardiologist (physician) and Advanced Practice Providers or APPs (Physician Assistants and Nurse Practitioners) who all work together to provide you with the care you need, when you need it.  Your next appointment:   1 year  Provider:   You may see Lanier Prude, MD or one of the following Advanced Practice Providers on your designated Care Team:   Francis Dowse, [place] [address]" [place], [place] Sherie Don, NP Canary Brim, NP

## 2023-12-24 ENCOUNTER — Encounter

## 2023-12-24 LAB — CUP PACEART INCLINIC DEVICE CHECK
Date Time Interrogation Session: 20250507084258
Implantable Lead Connection Status: 753985
Implantable Lead Implant Date: 20151002
Implantable Lead Location: 753860
Implantable Pulse Generator Implant Date: 20151002

## 2023-12-25 ENCOUNTER — Encounter: Payer: Self-pay | Admitting: Cardiology

## 2023-12-30 ENCOUNTER — Ambulatory Visit: Payer: Self-pay | Admitting: Cardiology

## 2024-01-17 ENCOUNTER — Encounter: Payer: Self-pay | Admitting: *Deleted

## 2024-01-22 ENCOUNTER — Encounter: Payer: Self-pay | Admitting: Cardiovascular Disease

## 2024-01-22 ENCOUNTER — Ambulatory Visit: Attending: Cardiovascular Disease | Admitting: Cardiovascular Disease

## 2024-01-22 VITALS — BP 98/64 | HR 79 | Ht 68.0 in | Wt 309.2 lb

## 2024-01-22 DIAGNOSIS — I493 Ventricular premature depolarization: Secondary | ICD-10-CM

## 2024-01-22 DIAGNOSIS — I428 Other cardiomyopathies: Secondary | ICD-10-CM

## 2024-01-22 DIAGNOSIS — I10 Essential (primary) hypertension: Secondary | ICD-10-CM | POA: Diagnosis not present

## 2024-01-22 DIAGNOSIS — E7800 Pure hypercholesterolemia, unspecified: Secondary | ICD-10-CM | POA: Diagnosis not present

## 2024-01-22 DIAGNOSIS — I5022 Chronic systolic (congestive) heart failure: Secondary | ICD-10-CM

## 2024-01-22 NOTE — Progress Notes (Signed)
 Cardiology Office Note   Date:  01/22/2024   ID:  Penny, Munoz [birth year], MRN [id number]  PCP:  Macie Saxon, MD  Cardiologist:   Antionette Kirks, MD   Chief Complaint  Patient presents with   Follow-up    OD 6 month f/u C/o sob. Meds reviewed verbally with pt.      History of Present Illness: Penny Munoz is a 73 y.o. female who is here today for follow-up visit regarding chronic systolic heart failure.  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 She moved to [place]  around 2016. She quit smoking at least 5 years ago.   Most recent echocardiogram in September 2024 showed an EF of 35 to 40% with global hypokinesis, mild  mitral regurgitation, mild aortic regurgitation and grade 1 diastolic dysfunction.  She has been stable from a cardiac standpoint with no chest pain or worsening dyspnea.  No significant change in weight or lower extremity edema.  She continues to be under significant stress due to chronic illnesses of her husband.  Past Medical History:  Diagnosis Date   CHF (congestive heart failure) (HCC)    COPD (chronic obstructive pulmonary disease) (HCC)    Coronary artery disease    Hypertension     Past Surgical History:  Procedure Laterality Date   CARDIAC DEFIBRILLATOR PLACEMENT       Current Outpatient Medications  Medication Sig Dispense Refill   acetaminophen  (TYLENOL ) 325 MG tablet Take by mouth as needed.      albuterol (VENTOLIN HFA) 108 (90 Base) MCG/ACT inhaler Inhale into the lungs as needed.     allopurinol (ZYLOPRIM) 300 MG tablet Take 300 mg by mouth daily.     carvedilol  (COREG ) 12.5 MG tablet  Take 1 tablet (12.5 mg total) by mouth 2 (two) times daily with a meal. 180 tablet 0   cetirizine (ZYRTEC) 10 MG tablet Take 10 mg by mouth daily.     citalopram (CELEXA) 20 MG tablet Take 20 mg by mouth daily.     fluticasone (FLONASE) 50 MCG/ACT nasal spray Place 1 spray into both nostrils daily.     furosemide  (LASIX ) 40 MG tablet Take 1 tablet (40 mg total) by mouth daily. (Patient taking differently: Take 40 mg by mouth every other day.) 90 tablet 3   GARLIC PO Take by mouth daily.     HYDROcodone -acetaminophen  (NORCO/VICODIN) 5-325 MG tablet Take 1 tablet by mouth every 6 (six) hours as needed. 15 tablet 0   Multiple Minerals (CALCIUM/MAGNESIUM/ZINC PO) Take by mouth. Takes 1 tablet every 3 days     Multiple Vitamins-Minerals (MULTIVITAMIN WITH MINERALS) tablet Take 1 tablet by mouth daily.     omeprazole (PRILOSEC) 20 MG capsule Take 20 mg by mouth daily.     sacubitril -valsartan  (ENTRESTO ) 24-26 MG Take 1 tablet by mouth 2 (two) times daily. 180 tablet 0   spironolactone  (ALDACTONE ) 25 MG tablet Take 1 tablet (25 mg total) by mouth daily. 90 tablet 0   VOLTAREN 1 % GEL Apply 1 a small amount to affected area four times a day     No current facility-administered medications for this visit.    Allergies:   Hydrocodone , Oxycodone hcl, Ampicillin, Amoxicillin, and Penicillins  Social History:  The patient  reports that she has never smoked. She has never used smokeless tobacco. She reports that she does not drink alcohol and does not use drugs.   Family History:  The patient's family history is not on file. She was adopted.  She does not know her family history   ROS:  Please see the history of present illness.   Otherwise, review of systems are positive for none.   All other systems are reviewed and negative.    PHYSICAL EXAM: VS:  BP 98/64 (BP Location: Left Arm, Patient Position: Sitting, Cuff Size: Large)   Pulse 79   Ht 5\' 8"  (1.727 m)   Wt (!) 309 lb 4 oz (140.3 kg)    SpO2 93%   BMI 47.02 kg/m  , BMI Body mass index is 47.02 kg/m. GEN: Well nourished, well developed, in no acute distress  HEENT: normal  Neck: no JVD, carotid bruits, or masses Cardiac: RRR with premature beats; no murmurs, rubs, or gallops, mild bilateral leg edema Respiratory:  clear to auscultation bilaterally, normal work of breathing GI: soft, nontender, nondistended, + BS MS: no deformity or atrophy  Skin: warm and dry, no rash Neuro:  Strength and sensation are intact Psych: euthymic mood, full affect   EKG:  EKG is ordered today. The ekg ordered today demonstrates: Sinus rhythm with frequent Premature ventricular complexes Right bundle branch block Left anterior fascicular block Bifascicular block When compared with ECG of 30-Mar-2023 13:27, Fusion complexes are no longer Present Premature ventricular complexes are now Present    Recent Labs: No results found for requested labs within last 365 days.    Lipid Panel No results found for: "CHOL", "TRIG", "HDL", "CHOLHDL", "VLDL", "LDLCALC", "LDLDIRECT"    Wt Readings from Last 3 Encounters:  01/22/24 (!) 309 lb 4 oz (140.3 kg)  12/19/23 (!) 305 lb (138.3 kg)  03/30/23 (!) 319 lb (144.7 kg)           07/15/2020    9:04 AM  PAD Screen  Previous PAD dx? No  Previous surgical procedure? No  Pain with walking? No  Feet/toe relief with dangling? No  Painful, non-healing ulcers? No  Extremities discolored? No      ASSESSMENT AND PLAN:   1.  Chronic systolic heart failure: Currently [place]  heart association class II.  She has prolonged history of nonischemic cardiomyopathy since early 2000.  Most recent ejection fraction was 35 to 40%.  Continue carvedilol , Entresto  and spironolactone .  I reviewed her labs done in July 2024 which showed normal renal function and electrolytes. I do not think she is a good candidate for SGLT2 inhibitors given her morbid obesity and increased risk of infections.  2.  Status  post ICD placement: Followed by Dr. Marven Slimmer.  3.  Essential hypertension: Blood pressure is controlled on current medications.  4.  PVCs: Noted on EKG but she denies palpitations.  The burden seems to be overall low.  Continue beta-blocker.  5.  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  This is followed by oncology.   Disposition:   FU in 6 months  Signed,  Antionette Kirks, MD  01/22/2024 3:15 PM    [facility]

## 2024-01-22 NOTE — Patient Instructions (Signed)
 Medication Instructions:  Your physician recommends that you continue on your current medications as directed. Please refer to the Current Medication list given to you today.    *If you need a refill on your cardiac medications before your next appointment, please call your pharmacy*  Lab Work: No labs ordered today   Testing/Procedures: No test ordered today   Follow-Up: At [facility], you and your health needs are our priority.  As part of our continuing mission to provide you with exceptional heart care, our providers are all part of one team.  This team includes your primary Cardiologist (physician) and Advanced Practice Providers or APPs (Physician Assistants and Nurse Practitioners) who all work together to provide you with the care you need, when you need it.  Your next appointment:   6 month(s)  Provider:   Antionette Kirks, MD

## 2024-01-30 ENCOUNTER — Encounter: Payer: Self-pay | Admitting: Pulmonary Disease

## 2024-01-30 ENCOUNTER — Ambulatory Visit: Admitting: Pulmonary Disease

## 2024-01-30 VITALS — BP 126/74 | HR 79 | Temp 96.9°F | Ht 68.0 in | Wt 309.0 lb

## 2024-01-30 DIAGNOSIS — Z87891 Personal history of nicotine dependence: Secondary | ICD-10-CM | POA: Diagnosis not present

## 2024-01-30 DIAGNOSIS — R0602 Shortness of breath: Secondary | ICD-10-CM

## 2024-01-30 MED ORDER — STIOLTO RESPIMAT 2.5-2.5 MCG/ACT IN AERS: 2.0000 | INHALATION_SPRAY | Freq: Every day | RESPIRATORY_TRACT | 3 refills | Status: DC

## 2024-01-30 MED ORDER — SPIRIVA RESPIMAT 1.25 MCG/ACT IN AERS: 2.0000 | INHALATION_SPRAY | Freq: Every day | RESPIRATORY_TRACT | 0 refills | Status: DC

## 2024-02-02 NOTE — Progress Notes (Signed)
 Synopsis: Referred in by Buren Rock HERO, MD   Subjective:   PATIENT ID: Penny Munoz GENDER: female DOB: June 30, 1951, MRN: [id number]  Chief Complaint  Patient presents with   Consult    SOB. Occasional cough. Diagnosed with COPD.     HPI Penny Munoz is a pleasant 73 year old female patient with a past medical history of heart failure with reduced EF secondary to nonischemic cardiomyopathy status post ICD placement in 2004 at [facility] [place] , essential hypertension, clinical diagnosis of COPD without any PFTs on file, obstructive sleep apnea on CPAP presenting today to the pulmonary clinic for ongoing shortness of breath specifically on exertion.  She reports that this is a chronic issue and has been having significant shortness of breath on exertion that limits her daily activities.  She does report some intermittent cough with sputum production.  She is a previous smoker and quit smoking 5 years ago.  She smoked 1 pack/day for many years.  She is currently not on any daily inhalers.   ROS All systems were reviewed and are negative except for the above.  Objective:   Vitals:   01/30/24 1316  BP: 126/74  Pulse: 79  Temp: (!) 96.9 F (36.1 C)  SpO2: 91%  Weight: (!) 309 lb (140.2 kg)  Height: 5' 8 (1.727 m)   91% on   RA BMI Readings from Last 3 Encounters:  01/30/24 46.98 kg/m  01/22/24 47.02 kg/m  12/19/23 46.38 kg/m   Wt Readings from Last 3 Encounters:  01/30/24 (!) 309 lb (140.2 kg)  01/22/24 (!) 309 lb 4 oz (140.3 kg)  12/19/23 (!) 305 lb (138.3 kg)    Physical Exam GEN: NAD, morbidly obese  HEENT: Supple Neck, Reactive Pupils, EOMI  CVS: Normal S1, Normal S2, RRR, No murmurs or ES appreciated  Lungs: Poor air movement.  Abdomen: Soft, non tender, non distended, + BS  Extremities: Warm and well perfused, No edema  Skin: No suspicious lesions appreciated  Psych: Normal Affect  Ancillary Information   CBC    Component Value  Date/Time   WBC 7.6 07/13/2021 1004   RBC 4.40 07/13/2021 1004   HGB 13.5 07/13/2021 1004   HCT 40.7 07/13/2021 1004   PLT 234 07/13/2021 1004   MCV 92.5 07/13/2021 1004   MCH 30.7 07/13/2021 1004   MCHC 33.2 07/13/2021 1004   RDW 13.9 07/13/2021 1004   LYMPHSABS 2.6 07/13/2021 1004   MONOABS 0.4 07/13/2021 1004   EOSABS 0.2 07/13/2021 1004   BASOSABS 0.0 07/13/2021 1004   Labs and imaging were reviewed .     No data to display           Assessment & Plan:  Penny Munoz is a pleasant 74 year old female patient with a past medical history of heart failure with reduced EF secondary to nonischemic cardiomyopathy status post ICD placement in 2004 at [facility] [place] , essential hypertension, clinical diagnosis of COPD without any PFTs on file, obstructive sleep apnea on CPAP presenting today to the pulmonary clinic for ongoing shortness of breath specifically on exertion.  #Clinical COPD unfortunately with no pfts on file   []  Obtain PFTs  []  Start spiriva  2 puffs daily and we will make adjustments once the above results.   #History of tobacco use  Will enroll in Lung cancer screening program on next visit.   #NICM with EF 25-30% s/p ICD in 2005. Follows with Dr. Darron.   Return in about 2 months (around 03/31/2024) for with the  pfts .  I spent 45 minutes caring for this patient today, including preparing to see the patient, obtaining a medical history , reviewing a separately obtained history, performing a medically appropriate examination and/or evaluation, counseling and educating the patient/family/caregiver, ordering medications, tests, or procedures, documenting clinical information in the electronic health record, and independently interpreting results (not separately reported/billed) and communicating results to the patient/family/caregiver  [name] Barn, MD [facility] 02/02/2024 3:37 PM

## 2024-02-20 ENCOUNTER — Other Ambulatory Visit: Payer: Self-pay

## 2024-02-20 MED ORDER — CARVEDILOL 12.5 MG PO TABS: 12.5000 mg | ORAL_TABLET | Freq: Two times a day (BID) | ORAL | 3 refills | Status: DC

## 2024-02-20 MED ORDER — SPIRONOLACTONE 25 MG PO TABS: 25.0000 mg | ORAL_TABLET | Freq: Every day | ORAL | 3 refills | Status: AC

## 2024-03-12 ENCOUNTER — Telehealth: Payer: Self-pay | Admitting: Cardiovascular Disease

## 2024-03-12 NOTE — Telephone Encounter (Signed)
 Caller Judith) wants a copy of patient's medication list and last office visit faxed to them at fax# [phone].

## 2024-03-24 ENCOUNTER — Encounter

## 2024-03-26 ENCOUNTER — Telehealth: Payer: Self-pay | Admitting: Cardiovascular Disease

## 2024-03-26 NOTE — Telephone Encounter (Signed)
Patient states that she was returning a call. Please advise

## 2024-05-21 ENCOUNTER — Encounter

## 2024-05-21 ENCOUNTER — Ambulatory Visit: Admitting: Pulmonary Disease

## 2024-05-29 ENCOUNTER — Other Ambulatory Visit: Payer: Self-pay | Admitting: Cardiovascular Disease

## 2024-06-23 ENCOUNTER — Encounter

## 2024-08-11 ENCOUNTER — Ambulatory Visit

## 2024-08-11 ENCOUNTER — Telehealth: Payer: Self-pay

## 2024-08-11 NOTE — Telephone Encounter (Signed)
 Alert received from [employer] for Alert remote transmission: RV Lead Integrity warning on 09-Aug-2024 due to  2 or more High Rate-NS episodes < 220 ms. and Sensing Integrity Counter >= 30 in 3 days.  224 short V-V since 12/2023. . 1 NSVT event x 1 sec, V-rate 176 bpm. 2 High rate NS event, RV oversensing. Recent decrease in VP and increase in HR via trending.  Follow up as scheduled.  Reviewed with Del, MDT Rep. who advised needs to check isometrics, pocket manipulation and discuss any possible outside factors that could contribute to short v-v intervals. Offered patient apt this morning at [facility] office, patient declined d/t transportation issues. Apt. Made tomorrow 08/12/24 @ 9:25 AM w/ Suzann, NP in [place]. Patient advised increased risk for inappropriate shock. Patient advised is she is shocked to go to ED. Pt voiced understanding and agreeable to plan.

## 2024-08-11 NOTE — Telephone Encounter (Signed)
 Pt called in stating that her ICD went off and she is concerned and would like a call back. Pt aware a nurse will call her back

## 2024-08-11 NOTE — Progress Notes (Unsigned)
 "     Electrophysiology Clinic Note    Date:  08/12/2024  Patient ID:  Penny Munoz 08/23/1950, MRN [id number] PCP:  Buren Rock HERO, MD  Cardiologist:  Deatrice Cage, MD  Electrophysiologist:  Fonda Kitty, MD  Electrophysiology APP:  Caswell Alvillar, NP     Discussed the use of AI scribe software for clinical note transcription with the patient, who gave verbal consent to proceed.   Patient Profile    Chief Complaint: device alert  History of Present Illness: Penny Munoz is a 73 y.o. female with PMH notable for HFrEF, NICM s/p ICD, non-obs CAD, HTN, OSA on CPAP, COPD; seen today for Fonda Kitty, MD (previously Dr. Cindie) for acute visit due to ICD concern.    She last saw Dr. Cindie 12/2023, at which time she was agreeable to remote monitoring, historically was not agreeable.  She called device clinic yesterday with concerns her device was alerting.  Remote transmission with concern for RV integrity and today's visit scheduled.   Today, she states that she stopped her lasix  about a month ago d/t concerns she was on two diuretics, spironolactone  and lasix . She has noticed increased fluid and SOB the past few weeks. She does not weigh herself at home.  Her ICD started beeping about 2 weeks ago. She initially thought it would stop, but when it persisted she called device clinic.  She denies chest pain, chest pressure, palpitations.     Her Son joins for appointment.  Arrhythmia/Device History MDT single chamber ICD, imp 2015; dx HFrEF    ROS:  Please see the history of present illness. All other systems are reviewed and otherwise negative.    Physical Exam    VS:  BP 130/78 (BP Location: Left Arm, Patient Position: Sitting, Cuff Size: Normal)   Pulse (!) 57   Ht 5' 8 (1.727 m)   Wt (!) 314 lb 6.4 oz (142.6 kg)   SpO2 90%   BMI 47.80 kg/m  BMI: Body mass index is 47.8 kg/m.           Wt Readings from Last 3 Encounters:  08/12/24 (!)  314 lb 6.4 oz (142.6 kg)  01/30/24 (!) 309 lb (140.2 kg)  01/22/24 (!) 309 lb 4 oz (140.3 kg)      GEN- The patient is chronically-ill appearing, alert and oriented x 3 today.   Lungs- diminished throughout, normal work of breathing.  Heart- Regular, bradycardic rate and rhythm, no murmurs, rubs or gallops Extremities- 1+ peripheral edema, warm, dry Skin-  device pocket well-healed, no tethering   Device interrogation done today and reviewed by myself:  Battery 19 months Presents VP at 40. Previously very low VP, increased within the last month Lead threshold stable  RV lead integrity warning issued 12/27 - several High rate episodes with elevated RV lead impedence  VF episode 12/30 early AM. Device appropriately delivered HV shock x 1 that converted to sinus rhythm  Increased base rate to VVI 70  MDT rep, Katie, assisted.    Studies Reviewed   Previous EP, cardiology notes.    EKG is ordered. Personal review of EKG from today shows:    EKG Interpretation Date/Time:  Tuesday August 12 2024 09:51:32 EST Ventricular Rate:  40 PR Interval:    QRS Duration:  184 QT Interval:  678 QTC Calculation: 552 R Axis:   -83  Text Interpretation: Complete heart block with Ventricular-paced rhythm with intrinsic complexes Confirmed by Jasmond River [phone]) on 08/12/2024  12:23:49 PM    TTE, 04/25/2023  1. Left ventricular ejection fraction, by estimation, is 35 to 40%. The left ventricle has moderately decreased function. The left ventricle demonstrates global hypokinesis. The left ventricular internal cavity size was mildly dilated. There is mild left ventricular hypertrophy. Left ventricular diastolic parameters are consistent with Grade I diastolic dysfunction (impaired relaxation).   2. Right ventricular systolic function is mildly reduced. The right ventricular size is normal.   3. Left atrial size was mildly dilated.   4. The mitral valve is normal in structure. Mild mitral  valve regurgitation. No evidence of mitral stenosis.   5. Tricuspid valve regurgitation is mild to moderate.   6. The aortic valve has an indeterminant number of cusps. Aortic valve regurgitation is mild. No aortic stenosis is present.   7. The inferior vena cava is normal in size with greater than 50% respiratory variability, suggesting right atrial pressure of 3 mmHg.    Assessment and Plan     #) NICM s/p single chamber ICD #) RV lead integrity warning #) VF s/p ICD shock #) HFrEF #) CHB  Patient seen today for RV lead integrity concern. EKG with CHB with VP, appears new based on RV pacing graph. She had episode of asymptomatic VF earlier today while sleeping that device delivered HV therapy. It is possible that the lead integrity is impaired leading to the VF episode not being appropriate, but given that the HV therapy converted from VF > VS with morphology change, appears likely appropriate.  No history of ICD shock.   She self-stopped her lasix  several weeks ago and appears fluid overloaded with increased SOB and lower extremity edema.  Discussed with EP MD at [facility] Recommend proceeding to [facility] ER for stat labs, TTE, LHC Anticipate will need eventual transfer to [facility] for device extraction, CRT-D implantation  Initially patient was quite anxious about going to ER d/t need to care for husband.  Son provided reassurance and patient was taken via wheelchair by clinic nursing staff        Current medicines are reviewed at length with the patient today.   The patient has concerns regarding her medicines.  The following changes were made today:  none at this time  Labs/ tests ordered today include:  Orders Placed This Encounter  Procedures   EKG 12-Lead   EKG 12-Lead     Disposition: Follow up with EP Team or EP APP based on hospital admission    Signed, Chantal Needle, NP  08/12/2024  12:24 PM  Electrophysiology [facility] "

## 2024-08-11 NOTE — Telephone Encounter (Signed)
 See EPIC note 08/11/24.

## 2024-08-12 ENCOUNTER — Encounter
Admission: EM | Disposition: A | Discharge status: Other Acute Inpatient Hospital | Payer: Self-pay | Source: Ambulatory Visit | Attending: Internal Medicine

## 2024-08-12 ENCOUNTER — Ambulatory Visit: Attending: Cardiology | Admitting: Cardiology

## 2024-08-12 ENCOUNTER — Other Ambulatory Visit: Payer: Self-pay

## 2024-08-12 ENCOUNTER — Encounter (HOSPITAL_COMMUNITY): Payer: Self-pay

## 2024-08-12 ENCOUNTER — Inpatient Hospital Stay
Admission: EM | Admit: 2024-08-12 | Discharge: 2024-08-13 | DRG: 286 | Disposition: A | Discharge status: Other Acute Inpatient Hospital | Source: Ambulatory Visit | Attending: Internal Medicine | Admitting: Internal Medicine

## 2024-08-12 ENCOUNTER — Emergency Department

## 2024-08-12 ENCOUNTER — Encounter: Payer: Self-pay | Admitting: Cardiology

## 2024-08-12 VITALS — BP 130/78 | HR 57 | Ht 68.0 in | Wt 314.4 lb

## 2024-08-12 DIAGNOSIS — I428 Other cardiomyopathies: Secondary | ICD-10-CM | POA: Diagnosis present

## 2024-08-12 DIAGNOSIS — Z95810 Presence of automatic (implantable) cardiac defibrillator: Secondary | ICD-10-CM

## 2024-08-12 DIAGNOSIS — T82198A Other mechanical complication of other cardiac electronic device, initial encounter: Secondary | ICD-10-CM | POA: Diagnosis not present

## 2024-08-12 DIAGNOSIS — Z91128 Patient's intentional underdosing of medication regimen for other reason: Secondary | ICD-10-CM

## 2024-08-12 DIAGNOSIS — I272 Pulmonary hypertension, unspecified: Secondary | ICD-10-CM | POA: Diagnosis present

## 2024-08-12 DIAGNOSIS — I493 Ventricular premature depolarization: Secondary | ICD-10-CM | POA: Diagnosis present

## 2024-08-12 DIAGNOSIS — I5022 Chronic systolic (congestive) heart failure: Secondary | ICD-10-CM | POA: Diagnosis not present

## 2024-08-12 DIAGNOSIS — R32 Unspecified urinary incontinence: Secondary | ICD-10-CM | POA: Diagnosis present

## 2024-08-12 DIAGNOSIS — I509 Heart failure, unspecified: Secondary | ICD-10-CM

## 2024-08-12 DIAGNOSIS — Z6841 Body Mass Index (BMI) 40.0 and over, adult: Secondary | ICD-10-CM

## 2024-08-12 DIAGNOSIS — T82119A Breakdown (mechanical) of unspecified cardiac electronic device, initial encounter: Principal | ICD-10-CM | POA: Diagnosis present

## 2024-08-12 DIAGNOSIS — I4901 Ventricular fibrillation: Secondary | ICD-10-CM | POA: Diagnosis present

## 2024-08-12 DIAGNOSIS — F319 Bipolar disorder, unspecified: Secondary | ICD-10-CM | POA: Diagnosis present

## 2024-08-12 DIAGNOSIS — I110 Hypertensive heart disease with heart failure: Secondary | ICD-10-CM | POA: Diagnosis present

## 2024-08-12 DIAGNOSIS — I442 Atrioventricular block, complete: Secondary | ICD-10-CM

## 2024-08-12 DIAGNOSIS — J4489 Other specified chronic obstructive pulmonary disease: Secondary | ICD-10-CM | POA: Diagnosis present

## 2024-08-12 DIAGNOSIS — Z880 Allergy status to penicillin: Secondary | ICD-10-CM

## 2024-08-12 DIAGNOSIS — I5023 Acute on chronic systolic (congestive) heart failure: Secondary | ICD-10-CM | POA: Diagnosis not present

## 2024-08-12 DIAGNOSIS — K219 Gastro-esophageal reflux disease without esophagitis: Secondary | ICD-10-CM | POA: Diagnosis present

## 2024-08-12 DIAGNOSIS — Z87891 Personal history of nicotine dependence: Secondary | ICD-10-CM

## 2024-08-12 DIAGNOSIS — I10 Essential (primary) hypertension: Secondary | ICD-10-CM | POA: Diagnosis present

## 2024-08-12 DIAGNOSIS — J45909 Unspecified asthma, uncomplicated: Secondary | ICD-10-CM | POA: Diagnosis present

## 2024-08-12 DIAGNOSIS — T501X6A Underdosing of loop [high-ceiling] diuretics, initial encounter: Secondary | ICD-10-CM | POA: Diagnosis present

## 2024-08-12 DIAGNOSIS — R001 Bradycardia, unspecified: Secondary | ICD-10-CM | POA: Diagnosis present

## 2024-08-12 DIAGNOSIS — Y712 Prosthetic and other implants, materials and accessory cardiovascular devices associated with adverse incidents: Secondary | ICD-10-CM | POA: Diagnosis present

## 2024-08-12 DIAGNOSIS — J449 Chronic obstructive pulmonary disease, unspecified: Secondary | ICD-10-CM | POA: Diagnosis present

## 2024-08-12 DIAGNOSIS — F411 Generalized anxiety disorder: Secondary | ICD-10-CM | POA: Diagnosis present

## 2024-08-12 DIAGNOSIS — E785 Hyperlipidemia, unspecified: Secondary | ICD-10-CM | POA: Diagnosis present

## 2024-08-12 DIAGNOSIS — Z4502 Encounter for adjustment and management of automatic implantable cardiac defibrillator: Secondary | ICD-10-CM

## 2024-08-12 DIAGNOSIS — G4733 Obstructive sleep apnea (adult) (pediatric): Secondary | ICD-10-CM | POA: Diagnosis present

## 2024-08-12 DIAGNOSIS — I2510 Atherosclerotic heart disease of native coronary artery without angina pectoris: Secondary | ICD-10-CM | POA: Diagnosis present

## 2024-08-12 DIAGNOSIS — Z79899 Other long term (current) drug therapy: Secondary | ICD-10-CM

## 2024-08-12 DIAGNOSIS — Z885 Allergy status to narcotic agent status: Secondary | ICD-10-CM

## 2024-08-12 HISTORY — PX: RIGHT/LEFT HEART CATH AND CORONARY ANGIOGRAPHY: CATH118266

## 2024-08-12 LAB — LIPID PANEL
Cholesterol: 178 mg/dL (ref 0–200)
HDL: 38 mg/dL — ABNORMAL LOW
LDL Cholesterol: 104 mg/dL — ABNORMAL HIGH (ref 0–99)
Total CHOL/HDL Ratio: 4.7 ratio
Triglycerides: 180 mg/dL — ABNORMAL HIGH
VLDL: 36 mg/dL (ref 0–40)

## 2024-08-12 LAB — POCT I-STAT 7, (LYTES, BLD GAS, ICA,H+H)
Acid-Base Excess: 0 mmol/L (ref 0.0–2.0)
Bicarbonate: 23 mmol/L (ref 20.0–28.0)
Calcium, Ion: 1.25 mmol/L (ref 1.15–1.40)
HCT: 41 % (ref 36.0–46.0)
Hemoglobin: 13.9 g/dL (ref 12.0–15.0)
O2 Saturation: 92 %
Potassium: 3.7 mmol/L (ref 3.5–5.1)
Sodium: 140 mmol/L (ref 135–145)
TCO2: 24 mmol/L (ref 22–32)
pCO2 arterial: 33.3 mmHg (ref 32–48)
pH, Arterial: 7.446 (ref 7.35–7.45)
pO2, Arterial: 59 mmHg — ABNORMAL LOW (ref 83–108)

## 2024-08-12 LAB — POCT I-STAT EG7
Acid-Base Excess: 0 mmol/L (ref 0.0–2.0)
Bicarbonate: 24.6 mmol/L (ref 20.0–28.0)
Calcium, Ion: 1.24 mmol/L (ref 1.15–1.40)
HCT: 40 % (ref 36.0–46.0)
Hemoglobin: 13.6 g/dL (ref 12.0–15.0)
O2 Saturation: 64 %
Potassium: 3.6 mmol/L (ref 3.5–5.1)
Sodium: 140 mmol/L (ref 135–145)
TCO2: 26 mmol/L (ref 22–32)
pCO2, Ven: 38.3 mmHg — ABNORMAL LOW (ref 44–60)
pH, Ven: 7.417 (ref 7.25–7.43)
pO2, Ven: 32 mmHg (ref 32–45)

## 2024-08-12 LAB — CUP PACEART INCLINIC DEVICE CHECK
Date Time Interrogation Session: 20251230125048
Implantable Lead Connection Status: 753985
Implantable Lead Implant Date: 20151002
Implantable Lead Location: 753860
Implantable Pulse Generator Implant Date: 20151002

## 2024-08-12 LAB — MRSA NEXT GEN BY PCR, NASAL: MRSA by PCR Next Gen: NOT DETECTED

## 2024-08-12 LAB — CBC
HCT: 41.1 % (ref 36.0–46.0)
Hemoglobin: 13.8 g/dL (ref 12.0–15.0)
MCH: 30.4 pg (ref 26.0–34.0)
MCHC: 33.6 g/dL (ref 30.0–36.0)
MCV: 90.5 fL (ref 80.0–100.0)
Platelets: 224 K/uL (ref 150–400)
RBC: 4.54 MIL/uL (ref 3.87–5.11)
RDW: 14 % (ref 11.5–15.5)
WBC: 7.1 K/uL (ref 4.0–10.5)
nRBC: 0 % (ref 0.0–0.2)

## 2024-08-12 LAB — APTT: aPTT: 34 s (ref 24–36)

## 2024-08-12 LAB — LACTIC ACID, PLASMA: Lactic Acid, Venous: 1.8 mmol/L (ref 0.5–1.9)

## 2024-08-12 LAB — TROPONIN T, HIGH SENSITIVITY: Troponin T High Sensitivity: 17 ng/L (ref 0–19)

## 2024-08-12 LAB — BASIC METABOLIC PANEL WITH GFR
Anion gap: 13 (ref 5–15)
BUN: 21 mg/dL (ref 8–23)
CO2: 22 mmol/L (ref 22–32)
Calcium: 10.1 mg/dL (ref 8.9–10.3)
Chloride: 103 mmol/L (ref 98–111)
Creatinine, Ser: 0.99 mg/dL (ref 0.44–1.00)
GFR, Estimated: 60 mL/min — ABNORMAL LOW
Glucose, Bld: 111 mg/dL — ABNORMAL HIGH (ref 70–99)
Potassium: 4.1 mmol/L (ref 3.5–5.1)
Sodium: 138 mmol/L (ref 135–145)

## 2024-08-12 LAB — MAGNESIUM: Magnesium: 2.1 mg/dL (ref 1.7–2.4)

## 2024-08-12 LAB — PROTIME-INR
INR: 1.1 (ref 0.8–1.2)
Prothrombin Time: 14.7 s (ref 11.4–15.2)

## 2024-08-12 LAB — GLUCOSE, CAPILLARY: Glucose-Capillary: 86 mg/dL (ref 70–99)

## 2024-08-12 LAB — PRO BRAIN NATRIURETIC PEPTIDE: Pro Brain Natriuretic Peptide: 1068 pg/mL — ABNORMAL HIGH

## 2024-08-12 MED ORDER — ALBUTEROL SULFATE (2.5 MG/3ML) 0.083% IN NEBU: 3.0000 mL | INHALATION_SOLUTION | Freq: Four times a day (QID) | RESPIRATORY_TRACT | Status: DC | PRN

## 2024-08-12 MED ORDER — LIDOCAINE HCL (PF) 1 % IJ SOLN
INTRAMUSCULAR | Status: DC | PRN
  Administered 2024-08-12 (×2): 2 mL

## 2024-08-12 MED ORDER — HYDRALAZINE HCL 20 MG/ML IJ SOLN: 10.0000 mg | INTRAMUSCULAR | Status: AC | PRN

## 2024-08-12 MED ORDER — VERAPAMIL HCL 2.5 MG/ML IV SOLN
INTRAVENOUS | Status: AC
  Filled 2024-08-12: qty 2

## 2024-08-12 MED ORDER — HEPARIN (PORCINE) IN NACL 1000-0.9 UT/500ML-% IV SOLN
INTRAVENOUS | Status: DC | PRN
  Administered 2024-08-12: 1000 mL

## 2024-08-12 MED ORDER — SODIUM CHLORIDE 0.9% FLUSH
3.0000 mL | Freq: Two times a day (BID) | INTRAVENOUS | Status: DC
  Administered 2024-08-12 – 2024-08-13 (×2): 3 mL via INTRAVENOUS

## 2024-08-12 MED ORDER — ALLOPURINOL 300 MG PO TABS
300.0000 mg | ORAL_TABLET | Freq: Every day | ORAL | Status: DC
  Administered 2024-08-13: 300 mg via ORAL
  Filled 2024-08-12: qty 1

## 2024-08-12 MED ORDER — ASPIRIN 81 MG PO CHEW: 81.0000 mg | CHEWABLE_TABLET | ORAL | Status: AC

## 2024-08-12 MED ORDER — SACUBITRIL-VALSARTAN 24-26 MG PO TABS
1.0000 | ORAL_TABLET | Freq: Two times a day (BID) | ORAL | Status: DC
  Administered 2024-08-12 – 2024-08-13 (×2): 1 via ORAL
  Filled 2024-08-12 (×3): qty 1

## 2024-08-12 MED ORDER — SODIUM CHLORIDE 0.9% FLUSH: 3.0000 mL | Freq: Two times a day (BID) | INTRAVENOUS | Status: DC

## 2024-08-12 MED ORDER — SPIRONOLACTONE 25 MG PO TABS: 25.0000 mg | ORAL_TABLET | Freq: Every day | ORAL | Status: DC

## 2024-08-12 MED ORDER — SENNOSIDES-DOCUSATE SODIUM 8.6-50 MG PO TABS: 1.0000 | ORAL_TABLET | Freq: Every evening | ORAL | Status: DC | PRN

## 2024-08-12 MED ORDER — UMECLIDINIUM-VILANTEROL 62.5-25 MCG/ACT IN AEPB
1.0000 | INHALATION_SPRAY | Freq: Every day | RESPIRATORY_TRACT | Status: DC
  Administered 2024-08-13: 1 via RESPIRATORY_TRACT
  Filled 2024-08-12: qty 14

## 2024-08-12 MED ORDER — SODIUM CHLORIDE 0.9% FLUSH: 3.0000 mL | INTRAVENOUS | Status: DC | PRN

## 2024-08-12 MED ORDER — IOHEXOL 300 MG/ML  SOLN
INTRAMUSCULAR | Status: DC | PRN
  Administered 2024-08-12: 44 mL

## 2024-08-12 MED ORDER — FENTANYL CITRATE (PF) 100 MCG/2ML IJ SOLN
INTRAMUSCULAR | Status: DC | PRN
  Administered 2024-08-12: 25 ug via INTRAVENOUS

## 2024-08-12 MED ORDER — TIOTROPIUM BROMIDE 1.25 MCG/ACT IN AERS: 2.0000 | INHALATION_SPRAY | Freq: Every day | RESPIRATORY_TRACT | Status: DC

## 2024-08-12 MED ORDER — SODIUM CHLORIDE 0.9% FLUSH
3.0000 mL | Freq: Two times a day (BID) | INTRAVENOUS | Status: DC
  Administered 2024-08-12 (×2): 3 mL via INTRAVENOUS

## 2024-08-12 MED ORDER — CHLORHEXIDINE GLUCONATE CLOTH 2 % EX PADS
6.0000 | MEDICATED_PAD | Freq: Every day | CUTANEOUS | Status: DC
  Administered 2024-08-13: 6 via TOPICAL

## 2024-08-12 MED ORDER — FENTANYL CITRATE (PF) 100 MCG/2ML IJ SOLN
INTRAMUSCULAR | Status: AC
  Filled 2024-08-12: qty 2

## 2024-08-12 MED ORDER — SODIUM CHLORIDE 0.9 % IV SOLN: 250.0000 mL | INTRAVENOUS | Status: DC | PRN

## 2024-08-12 MED ORDER — HEPARIN SODIUM (PORCINE) 1000 UNIT/ML IJ SOLN
INTRAMUSCULAR | Status: AC
  Filled 2024-08-12: qty 10

## 2024-08-12 MED ORDER — ONDANSETRON HCL 4 MG/2ML IJ SOLN: 4.0000 mg | Freq: Four times a day (QID) | INTRAMUSCULAR | Status: DC | PRN

## 2024-08-12 MED ORDER — ONDANSETRON HCL 4 MG PO TABS: 4.0000 mg | ORAL_TABLET | Freq: Four times a day (QID) | ORAL | Status: DC | PRN

## 2024-08-12 MED ORDER — ENOXAPARIN SODIUM 40 MG/0.4ML IJ SOSY: 40.0000 mg | PREFILLED_SYRINGE | INTRAMUSCULAR | Status: DC

## 2024-08-12 MED ORDER — FREE WATER: 250.0000 mL | Freq: Once | Status: DC

## 2024-08-12 MED ORDER — SODIUM CHLORIDE 0.9 % IV SOLN: 250.0000 mL | INTRAVENOUS | Status: AC | PRN

## 2024-08-12 MED ORDER — HEPARIN SODIUM (PORCINE) 1000 UNIT/ML IJ SOLN
INTRAMUSCULAR | Status: DC | PRN
  Administered 2024-08-12: 5000 [IU] via INTRAVENOUS

## 2024-08-12 MED ORDER — ACETAMINOPHEN 650 MG RE SUPP: 650.0000 mg | Freq: Four times a day (QID) | RECTAL | Status: DC | PRN

## 2024-08-12 MED ORDER — CITALOPRAM HYDROBROMIDE 20 MG PO TABS
20.0000 mg | ORAL_TABLET | Freq: Every day | ORAL | Status: DC
  Administered 2024-08-13: 20 mg via ORAL
  Filled 2024-08-12: qty 1

## 2024-08-12 MED ORDER — HEPARIN SODIUM (PORCINE) 5000 UNIT/ML IJ SOLN
5000.0000 [IU] | Freq: Three times a day (TID) | INTRAMUSCULAR | Status: DC
  Administered 2024-08-12: 5000 [IU] via SUBCUTANEOUS
  Filled 2024-08-12: qty 1

## 2024-08-12 MED ORDER — ASPIRIN 81 MG PO CHEW
CHEWABLE_TABLET | ORAL | Status: AC
  Administered 2024-08-12: 81 mg via ORAL
  Filled 2024-08-12: qty 1

## 2024-08-12 MED ORDER — LIDOCAINE HCL 1 % IJ SOLN
INTRAMUSCULAR | Status: AC
  Filled 2024-08-12: qty 20

## 2024-08-12 MED ORDER — HYDRALAZINE HCL 20 MG/ML IJ SOLN: 10.0000 mg | INTRAMUSCULAR | Status: DC | PRN

## 2024-08-12 MED ORDER — VERAPAMIL HCL 2.5 MG/ML IV SOLN
INTRAVENOUS | Status: DC | PRN
  Administered 2024-08-12: 2.5 mg via INTRA_ARTERIAL

## 2024-08-12 MED ORDER — MIDAZOLAM HCL 2 MG/2ML IJ SOLN
INTRAMUSCULAR | Status: AC
  Filled 2024-08-12: qty 2

## 2024-08-12 MED ORDER — HEPARIN (PORCINE) IN NACL 1000-0.9 UT/500ML-% IV SOLN
INTRAVENOUS | Status: AC
  Filled 2024-08-12: qty 1000

## 2024-08-12 MED ORDER — PANTOPRAZOLE SODIUM 40 MG PO TBEC
40.0000 mg | DELAYED_RELEASE_TABLET | Freq: Every day | ORAL | Status: DC
  Administered 2024-08-13: 40 mg via ORAL
  Filled 2024-08-12: qty 1

## 2024-08-12 MED ORDER — ACETAMINOPHEN 325 MG PO TABS: 650.0000 mg | ORAL_TABLET | Freq: Four times a day (QID) | ORAL | Status: DC | PRN

## 2024-08-12 MED ORDER — BUDESON-GLYCOPYRROL-FORMOTEROL 160-9-4.8 MCG/ACT IN AERO
2.0000 | INHALATION_SPRAY | Freq: Two times a day (BID) | RESPIRATORY_TRACT | Status: DC
  Filled 2024-08-12: qty 5.9

## 2024-08-12 MED ORDER — ENOXAPARIN SODIUM 80 MG/0.8ML IJ SOSY: 0.5000 mg/kg | PREFILLED_SYRINGE | INTRAMUSCULAR | Status: DC

## 2024-08-12 MED ORDER — FUROSEMIDE 40 MG PO TABS
40.0000 mg | ORAL_TABLET | Freq: Every day | ORAL | Status: DC
  Filled 2024-08-12: qty 1

## 2024-08-12 NOTE — Interval H&P Note (Signed)
 History and Physical Interval Note:  08/12/2024 4:13 PM  Penny Munoz  has presented today for surgery, with the diagnosis of Chronic HFrEF and ICD shock.  The various methods of treatment have been discussed with the patient and family. After consideration of risks, benefits and other options for treatment, the patient has consented to  Procedures: RIGHT/LEFT HEART CATH AND CORONARY ANGIOGRAPHY (N/A) as a surgical intervention.  The patient's history has been reviewed, patient examined, no change in status, stable for surgery.  I have reviewed the patient's chart and labs.  Questions were answered to the patient's satisfaction.    Cath Lab Visit (complete for each Cath Lab visit)  Clinical Evaluation Leading to the Procedure:   ACS: No.  Non-ACS:    Anginal/Heart Failure Classification: NYHA class IV  Anti-ischemic medical therapy: Minimal Therapy (1 class of medications)  Non-Invasive Test Results: No non-invasive testing performed  Prior CABG: No previous CABG  Derric Dealmeida

## 2024-08-12 NOTE — H&P (View-Only) (Signed)
 "  Cardiology Consultation   Patient ID: Penny Munoz MRN: [id number]; DOB: December 05, 1950  Admit date: 08/12/2024 Date of Consult: 08/12/2024  PCP:  Buren Rock HERO, MD   [facility] Providers Cardiologist:  Deatrice Cage, MD  Electrophysiologist:  Fonda Kitty, MD  Electrophysiology APP:  Riddle, Suzann, NP       Patient Profile: Penny Munoz is a 73 y.o. female with a hx of HFrEF, nonischemic cardiomyopathy, s/p ICD (2004), hypertension, retroperitoneal mass, prior tobacco use, OSA on CPAP, COPD, and nonobstructive CAD (2015) who is being seen 08/12/2024 for the evaluation of ICD shock at the request of Dr. Arlander.  History of Present Illness:   [name] reported being diagnosed with nonischemic cardiomyopathy currently in the early 2000's while residing in [place] .  She had ICD placement in 2004 at the [facility] [place] .  Last heart catheterization was 12/2013 with nonobstructive disease.  Most recent echo 04/2023 showed EF 35 to 40% with global hypokinesis, mild MR, mild AI, and G1 DD.    Patient called the device clinic yesterday morning with concerns about her ICD was alerting.  She reported that her ICD had started beeping several days ago.  She initially thought it would stop but when it persisted she decided to call the device clinic.  Remote transmission was concerning for RV integrity.  Her device delivered a shock last night.  When asked about this, patient reports that she was pushing the bed in her bedroom and it was stuck on a pillow making it more difficult to push.  She then reports falling onto her bed and losing control of her bladder.  She then felt an instance of severe chest pain which quickly resolved.  Separately, she reports that she has not been taking her Lasix  for several weeks.  She was concerned due to being on 2 diuretics, spironolactone  and Lasix .  Since then, she has noticed increased shortness of breath and lower  extremity swelling.  She denies orthopnea and PND.  She denies palpitations.  She was seen in the EP clinic this morning and noted to be in complete heart block. Device interrogation revealed an episode of VF for which her device delivered an appropriate shock. It was recommended that she proceed to the ED for further evaluation.   In the ED, vital signs are unremarkable.  Pertinent labs include K4.1, BUN 21, creatinine 0.99, Hgb 13.8, HCT 41.1.  proBNP minimally elevated at 1068.  Troponin negative.  Chest x-ray without active cardiopulmonary disease.  EKG shows ventricular paced rhythm with intermittent PVCs, rate 83 bpm.  Cardiology was asked to consult for further evaluation.  At time of cardiology consult, patient reports feeling well.  She is very worried about everything going on with her heart.  She denies any further chest pain.    Past Medical History:  Diagnosis Date   CHF (congestive heart failure) (HCC)    COPD (chronic obstructive pulmonary disease) (HCC)    Coronary artery disease    Hypertension     Past Surgical History:  Procedure Laterality Date   CARDIAC DEFIBRILLATOR PLACEMENT         Scheduled Meds:  Continuous Infusions:  PRN Meds:   Allergies:   Allergies[1]  Social History:   Social History   Socioeconomic History   Marital status: Married    Spouse name: Not on file   Number of children: Not on file   Years of education: Not on file   Highest education level:  Not on file  Occupational History   Not on file  Tobacco Use   Smoking status: Former    Current packs/day: 0.00    Types: Cigarettes    Quit date: 2015    Years since quitting: 11.0   Smokeless tobacco: Never   Tobacco comments:    Started smoking at 73 years old    Smoked 1 PPD at her heaviest    Quit smoking in 2015  Vaping Use   Vaping status: Never Used  Substance and Sexual Activity   Alcohol use: No   Drug use: No   Sexual activity: Not on file  Other Topics Concern   Not  on file  Social History Narrative   Not on file   Social Drivers of Health   Tobacco Use: Medium Risk (08/12/2024)   Patient History    Smoking Tobacco Use: Former    Smokeless Tobacco Use: Never    Passive Exposure: Not on Actuary Strain: Not on file  Food Insecurity: Not on file  Transportation Needs: Not on file  Physical Activity: Not on file  Stress: Not on file  Social Connections: Not on file  Intimate Partner Violence: Not on file  Depression (PHQ2-9): Not on file  Alcohol Screen: Not on file  Housing: Not on file  Utilities: Not on file  Health Literacy: Not on file    Family History:    Family History  Adopted: Yes     ROS:  Please see the history of present illness.   Physical Exam/Data: Vitals:   08/12/24 1216 08/12/24 1230 08/12/24 1300 08/12/24 1345  BP:  (!) 128/113 120/75 122/63  Pulse:  (!) 102 69 84  Resp:  15 19 17   Temp:      TempSrc:      SpO2: 95% 96% 95% (!) 76%   No intake or output data in the 24 hours ending 08/12/24 1418    08/12/2024    9:26 AM 01/30/2024    1:16 PM 01/22/2024    3:03 PM  Last 3 Weights  Weight (lbs) 314 lb 6.4 oz 309 lb 309 lb 4 oz  Weight (kg) 142.611 kg 140.161 kg 140.275 kg     There is no height or weight on file to calculate BMI.  General:  Well nourished, well developed, in no acute distress HEENT: normal Neck: Difficult to assess JVD due to body habitus Vascular: No carotid bruits; Distal pulses 2+ bilaterally Cardiac:  RRR; normal S1, S2; no murmur Lungs:  clear to auscultation bilaterally, no wheezing, rhonchi or rales  Abd: soft, nontender, no hepatomegaly  Ext: trace LE edema Skin: warm and dry  Psych:  Normal affect   EKG:  The EKG was personally reviewed and demonstrates:  ventricular paced rhythm with intermittent PVCs, rate 82 bpm Telemetry:  Telemetry was personally reviewed and demonstrates:  ventricular paced rhythm with intermittent PVCs and underlying complete heart  block, rate ~70 bpm  Relevant CV Studies:  04/2023 Echo complete 1. Left ventricular ejection fraction, by estimation, is 35 to 40%. The  left ventricle has moderately decreased function. The left ventricle  demonstrates global hypokinesis. The left ventricular internal cavity size  was mildly dilated. There is mild  left ventricular hypertrophy. Left ventricular diastolic parameters are  consistent with Grade I diastolic dysfunction (impaired relaxation).   2. Right ventricular systolic function is mildly reduced. The right  ventricular size is normal.   3. Left atrial size was mildly dilated.  4. The mitral valve is normal in structure. Mild mitral valve  regurgitation. No evidence of mitral stenosis.   5. Tricuspid valve regurgitation is mild to moderate.   6. The aortic valve has an indeterminant number of cusps. Aortic valve  regurgitation is mild. No aortic stenosis is present.   7. The inferior vena cava is normal in size with greater than 50%  respiratory variability, suggesting right atrial pressure of 3 mmHg.   Laboratory Data: High Sensitivity Troponin:  No results for input(s): TROPONINIHS in the last 720 hours.  Recent Labs  Lab 08/12/24 1116  TRNPT 17      Chemistry Recent Labs  Lab 08/12/24 1116  NA 138  K 4.1  CL 103  CO2 22  GLUCOSE 111*  BUN 21  CREATININE 0.99  CALCIUM 10.1  GFRNONAA 60*  ANIONGAP 13    No results for input(s): PROT, ALBUMIN, AST, ALT, ALKPHOS, BILITOT in the last 168 hours. Lipids No results for input(s): CHOL, TRIG, HDL, LABVLDL, LDLCALC, CHOLHDL in the last 168 hours.  Hematology Recent Labs  Lab 08/12/24 1116  WBC 7.1  RBC 4.54  HGB 13.8  HCT 41.1  MCV 90.5  MCH 30.4  MCHC 33.6  RDW 14.0  PLT 224   Thyroid No results for input(s): TSH, FREET4 in the last 168 hours.  BNP Recent Labs  Lab 08/12/24 1116  PROBNP 1,068.0*    DDimer No results for input(s): DDIMER in the last 168  hours.  Radiology/Studies:  DG Chest 2 View Result Date: 08/12/2024 CLINICAL DATA:  Chest pain.  Shortness of breath. EXAM: CHEST - 2 VIEW COMPARISON:  May 30, 2017. FINDINGS: Stable cardiomegaly. Left-sided defibrillator is unchanged. Both lungs are clear. The visualized skeletal structures are unremarkable. IMPRESSION: No active cardiopulmonary disease. Electronically Signed   By: Lynwood Landy Raddle M.D.   On: 08/12/2024 13:20   CUP PACEART INCLINIC DEVICE CHECK Result Date: 08/12/2024 Abnormal in-clinic _single__ chamber ICD check. Presenting Rhythm: _VP__ . Routine testing was performed. Threshold stable. RV integrity warning for elevated impedence, high rate NS episodes. VF episode 12/30, appears appropriate. HV therapy x 1 with clean break to VS/VP. See OV Note for further discussion. Estimated longevity _19 months___ . Pt is not enrolled in remote follow-up. CANDIE Needle, NP    Assessment and Plan:  Nonischemic cardiomyopathy s/p ICD RV lead integrity warning VF s/p ICD shock Heart block - Presenting after referral from EP clinic for VF with ICD shock and concern for RV lead integrity - Patient reports chest pain around 2 AM this morning when she was pushing her bed when she suddenly fell and loss control of her bladder - Device interrogation revealed an episode of VF with HV therapy converting to VS rhythm - EKG and telemetry show ventricular paced rhythm with intermittent PVCs and underlying complete heart block - K and mag wnl - Echo ordered - Recommend proceeding with R/LHC to assess for ischemic cause of VF and to assess volume status, see below - Discussed with EP colleagues who recommend eventual transfer to [facility] for device extraction and CRT-D implantation  Acute on chronic HFrEF - Most recent echo 04/2023 with EF 35-40% and global hypokinesis - ProBNP mildly elevated on admission in the setting of noncompliance with outpatient diuretic regimen for several weeks -  Volume status is difficult to assess due to body habitus, although she does not appear grossly volume overloaded - Echo ordered as above - Will likely require IV diuresis pending results  from [facility]  - Monitor kidney function, strict I/Os, and daily weights with diuresis   Informed Consent   Shared Decision Making/Informed Consent The risks, including but not limited to, [bleeding or vascular complications (1 in 500), pneumothorax (1 in 1600), arrhythmia (1 in 1000) and death (1 in 5000)], benefits (diagnostic support and/or management of heart failure, pulmonary hypertension) and alternatives of a right heart catheterization were discussed in detail with Ms. Jocelyn Byers and she is willing to proceed. The risks [stroke (1 in 1000), death (1 in 1000), kidney failure [usually temporary] (1 in 500), bleeding (1 in 200), allergic reaction [possibly serious] (1 in 200)], benefits (diagnostic support and management of coronary artery disease) and alternatives of a cardiac catheterization were discussed in detail with Ms. Jocelyn Byers and she is willing to proceed.      For questions or updates, please contact [facility] Please consult [email or web address] for contact info under      Signed, Lesley LITTIE Maffucci, PA-C  08/12/2024 2:18 PM     [1]  Allergies Allergen Reactions   Hydrocodone  Other (See Comments)    took one pill can't tolerate took one pill can't tolerate    Oxycodone Hcl Shortness Of Breath   Ampicillin     Other reaction(s): Angioedema Other reaction(s): Angioedema    Amoxicillin Rash   Penicillins Anxiety   "

## 2024-08-12 NOTE — Discharge Summary (Signed)
 " Physician Discharge Summary   Patient: Penny Munoz MRN: [id number] DOB: Apr 21, 1951  Admit date:     08/12/2024  Discharge date: 08/12/2024  Discharge Physician: Morene Bathe   PCP: Buren Rock HERO, MD   Recommendations at discharge:    Ensure Cardiology and EP are consulted for this patient.  Echocardiogram ordered but not completed at [facility]. Please order again if necessary.   Discharge Diagnoses: Principal Problem:   Malfunction of implantable defibrillator ventricular (ICD) lead Active Problems:   GAD (generalized anxiety disorder)   Asthma   Bipolar disorder (HCC)   CHF (congestive heart failure) (HCC)   Chronic obstructive pulmonary disease (HCC)   GERD (gastroesophageal reflux disease)   Hypertension   ICD (implantable cardioverter-defibrillator) in place   Acute on chronic HFrEF (heart failure with reduced ejection fraction) (HCC)   VF (ventricular fibrillation) (HCC)  Resolved Problems:   * No resolved hospital problems. *  Hospital Course: HPI Penny Munoz is a 73 y.o. year old female with medical history of hypertension, hyperlipidemia, COPD, CHF 2/2 to [facility] s/p ICD placement presenting from EP clinic after going there for acute visit for ICD device alert.  EKG there showed complete heart block with ventricular paced rhythms and device interrogation showed patient was in V-fib earlier and was shocked to sinus rhythm. It showed concern for RV lead impedence. Given these findings she will instructed to come to the ED for further evaluation with likely left heart cath, TEE and likely device extraction. Pt states she felt her body going numb and she had urinary incontinence. She is unable to give further details into this. States no URI symptoms. No fevers or chills. States she stopped taking her lasix  around 4 weeks ago as she was already on a different fluid pill. EDP discussed case with cardiology who are planning for left heart cath today. On arrival to the  ED patient was noted to be HDS stable.  Lab work and imaging obtained.  CBC unremarkable.  BMP grossly unremarkable.  Troponin normal with repeat pending.  proBNP elevated at 1068.  Magnesium normal.  Lactic acid normal.  Chest x-ray without any acute findings.  Given need for further care, [facility] contacted for admission.    Assessment and Plan: Patient with nonischemic cardiomyopathy with EF 35% status post ICD placement with ICD malfunction admitted to [facility] and underwent cardiac catheterization did not show any angiographically significant coronary artery disease. Per outpatient EP notes patient will likely need device extraction as well.  EP requesting patient transfer to [facility] for further evaluation and likely device extraction given concern for RV lead impedance.  Discussed with cardiology and they are agreeable with this plan.  Cardiology recommendations post cath are listed below.  They have scheduled all cardiovascular medications to be started tomorrow. Cardiology recommendations post cath: Maintain net even to slightly negative fluid balance. Continue goal-directed medical therapy for nonischemic cardiomyopathy. Further management of VF and concern for ICD RV lead dysfunction per electrophysiology.  Hypertension: Will continue home Entresto , spironolactone  starting 12/31.  Holding Coreg  until cleared by cardiology given EKG showing complete heart block.   Hyperlipidemia: Lipid panel pending. Goal is <70 given CAD.   COPD: Not in exacerbation.  Continued home inhalers.   Bipolar/GAD: Continued home med.   GERD: Continued home PPI.  OSA on CPAP: nightly cpap ordered.      Consultants: Cardiology Procedures performed: Left and right heart cath Disposition: [facility] Diet recommendation: Heart healthy diet.  N.p.o. after  midnight on 12/30. DISCHARGE MEDICATION: Allergies as of 08/12/2024       Reactions   Hydrocodone  Other (See Comments)   took one pill can't  tolerate took one pill can't tolerate   Oxycodone Hcl Shortness Of Breath   Ampicillin    Other reaction(s): Angioedema Other reaction(s): Angioedema   Amoxicillin Rash   Penicillins Anxiety        Medication List     PAUSE taking these medications    carvedilol  12.5 MG tablet Wait to take this until your doctor or other care provider tells you to start again. Commonly known as: COREG  Take 1 tablet (12.5 mg total) by mouth 2 (two) times daily with a meal.       STOP taking these medications    acetaminophen  325 MG tablet Commonly known as: TYLENOL    HYDROcodone -acetaminophen  5-325 MG tablet Commonly known as: NORCO/VICODIN   Spiriva  Respimat 1.25 MCG/ACT Aers Generic drug: Tiotropium Bromide       TAKE these medications    albuterol 108 (90 Base) MCG/ACT inhaler Commonly known as: VENTOLIN HFA Inhale into the lungs as needed.   allopurinol 300 MG tablet Commonly known as: ZYLOPRIM Take 300 mg by mouth daily.   CALCIUM/MAGNESIUM/ZINC PO Take by mouth. Takes 1 tablet every 3 days   cetirizine 10 MG tablet Commonly known as: ZYRTEC Take 10 mg by mouth daily.   citalopram 20 MG tablet Commonly known as: CELEXA Take 20 mg by mouth daily.   fluticasone 50 MCG/ACT nasal spray Commonly known as: FLONASE Place 1 spray into both nostrils daily.   furosemide  40 MG tablet Commonly known as: LASIX  Take 1 tablet (40 mg total) by mouth daily.   GARLIC PO Take by mouth daily.   hydrALAZINE 20 MG/ML injection Commonly known as: APRESOLINE Inject 0.5 mLs (10 mg total) into the vein every 20 (twenty) minutes as needed (high blood pressure).   multivitamin with minerals tablet Take 1 tablet by mouth daily.   omeprazole 20 MG capsule Commonly known as: PRILOSEC Take 20 mg by mouth daily.   sacubitril -valsartan  24-26 MG Commonly known as: ENTRESTO  TAKE 1 TABLET TWICE A DAY   spironolactone  25 MG tablet Commonly known as: ALDACTONE  Take 1 tablet (25  mg total) by mouth daily.   Trelegy Ellipta 100-62.5-25 MCG/ACT Aepb Generic drug: Fluticasone-Umeclidin-Vilant Inhale 1 Inhalation into the lungs daily.   Voltaren 1 % Gel Generic drug: diclofenac Sodium Apply 1 a small amount to affected area four times a day        Discharge Exam: Filed Weights   08/12/24 1841  Weight: (!) 143.4 kg     Condition at discharge: stable  The results of significant diagnostics from this hospitalization (including imaging, microbiology, ancillary and laboratory) are listed below for reference.   Imaging Studies: CARDIAC CATHETERIZATION Result Date: 08/12/2024   There is moderate to severe left ventricular systolic dysfunction.   LV end diastolic pressure is mildly elevated.   There is no aortic valve stenosis.   Anticipated discharge date to be determined.   No indication for antiplatelet therapy at this time . Conclusions: No angiographically significant coronary artery disease. Moderately to severely reduced left ventricular systolic function (evaluation limited by suboptimal opacification of the left ventricle). Mildly elevated left heart filling pressures (PCWP 19, LVEDP 20 mmHg). Moderately elevated right heart filling pressures (mean RA/RV EDP 12 mmHg). Mild pulmonary hypertension (PA 36/19, mean 25 mmHg). Normal Fick cardiac output/index (CO 6.3 L/min, CI 2.6 L/min/m). Recommendations: Maintain net even  to slightly negative fluid balance. Continue goal-directed medical therapy for nonischemic cardiomyopathy. Further management of VF and concern for ICD RV lead dysfunction per electrophysiology. Lonni Hanson, MD [facility]  DG Chest 2 View Result Date: 08/12/2024 CLINICAL DATA:  Chest pain.  Shortness of breath. EXAM: CHEST - 2 VIEW COMPARISON:  May 30, 2017. FINDINGS: Stable cardiomegaly. Left-sided defibrillator is unchanged. Both lungs are clear. The visualized skeletal structures are unremarkable. IMPRESSION: No active  cardiopulmonary disease. Electronically Signed   By: Lynwood Landy Raddle M.D.   On: 08/12/2024 13:20   CUP PACEART INCLINIC DEVICE CHECK Result Date: 08/12/2024 Abnormal in-clinic _single__ chamber ICD check. Presenting Rhythm: _VP__ . Routine testing was performed. Threshold stable. RV integrity warning for elevated impedence, high rate NS episodes. VF episode 12/30, appears appropriate. HV therapy x 1 with clean break to VS/VP. See OV Note for further discussion. Estimated longevity _19 months___ . Pt is not enrolled in remote follow-up. CANDIE Needle, NP   Microbiology: Results for orders placed or performed during the hospital encounter of 08/12/24  MRSA Next Gen by PCR, Nasal     Status: None   Collection Time: 08/12/24  6:24 PM   Specimen: Nasal Mucosa; Nasal Swab  Result Value Ref Range Status   MRSA by PCR Next Gen NOT DETECTED NOT DETECTED Final    Comment: (NOTE) The GeneXpert MRSA Assay (FDA approved for NASAL specimens only), is one component of a comprehensive MRSA colonization surveillance program. It is not intended to diagnose MRSA infection nor to guide or monitor treatment for MRSA infections. Test performance is not FDA approved in patients less than 2 years old. Performed at [facility], [address] [place], [place] [address]     Labs: CBC: Recent Labs  Lab 08/12/24 1116 08/12/24 1649 08/12/24 1654  WBC 7.1  --   --   HGB 13.8 13.6 13.9  HCT 41.1 40.0 41.0  MCV 90.5  --   --   PLT 224  --   --    Basic Metabolic Panel: Recent Labs  Lab 08/12/24 1116 08/12/24 1649 08/12/24 1654  NA 138 140 140  K 4.1 3.6 3.7  CL 103  --   --   CO2 22  --   --   GLUCOSE 111*  --   --   BUN 21  --   --   CREATININE 0.99  --   --   CALCIUM 10.1  --   --   MG 2.1  --   --    Liver Function Tests: No results for input(s): AST, ALT, ALKPHOS, BILITOT, PROT, ALBUMIN in the last 168 hours. CBG: Recent Labs  Lab 08/12/24 1825  GLUCAP 86     Discharge time spent: less than 30 minutes.  Signed: Morene Bathe, MD [facility] 08/12/2024 "

## 2024-08-12 NOTE — Consult Note (Signed)
 "  Cardiology Consultation   Patient ID: Penny Munoz MRN: [id number]; DOB: December 05, 1950  Admit date: 08/12/2024 Date of Consult: 08/12/2024  PCP:  Buren Rock HERO, MD   [facility] Providers Cardiologist:  Deatrice Cage, MD  Electrophysiologist:  Fonda Kitty, MD  Electrophysiology APP:  Riddle, Suzann, NP       Patient Profile: Penny Munoz is a 73 y.o. female with a hx of HFrEF, nonischemic cardiomyopathy, s/p ICD (2004), hypertension, retroperitoneal mass, prior tobacco use, OSA on CPAP, COPD, and nonobstructive CAD (2015) who is being seen 08/12/2024 for the evaluation of ICD shock at the request of Dr. Arlander.  History of Present Illness:   Penny Munoz reported being diagnosed with nonischemic cardiomyopathy currently in the early 2000's while residing in [place] .  She had ICD placement in 2004 at the [facility] [place] .  Last heart catheterization was 12/2013 with nonobstructive disease.  Most recent echo 04/2023 showed EF 35 to 40% with global hypokinesis, mild MR, mild AI, and G1 DD.    Patient called the device clinic yesterday morning with concerns about her ICD was alerting.  She reported that her ICD had started beeping several days ago.  She initially thought it would stop but when it persisted she decided to call the device clinic.  Remote transmission was concerning for RV integrity.  Her device delivered a shock last night.  When asked about this, patient reports that she was pushing the bed in her bedroom and it was stuck on a pillow making it more difficult to push.  She then reports falling onto her bed and losing control of her bladder.  She then felt an instance of severe chest pain which quickly resolved.  Separately, she reports that she has not been taking her Lasix  for several weeks.  She was concerned due to being on 2 diuretics, spironolactone  and Lasix .  Since then, she has noticed increased shortness of breath and lower  extremity swelling.  She denies orthopnea and PND.  She denies palpitations.  She was seen in the EP clinic this morning and noted to be in complete heart block. Device interrogation revealed an episode of VF for which her device delivered an appropriate shock. It was recommended that she proceed to the ED for further evaluation.   In the ED, vital signs are unremarkable.  Pertinent labs include K4.1, BUN 21, creatinine 0.99, Hgb 13.8, HCT 41.1.  proBNP minimally elevated at 1068.  Troponin negative.  Chest x-ray without active cardiopulmonary disease.  EKG shows ventricular paced rhythm with intermittent PVCs, rate 83 bpm.  Cardiology was asked to consult for further evaluation.  At time of cardiology consult, patient reports feeling well.  She is very worried about everything going on with her heart.  She denies any further chest pain.    Past Medical History:  Diagnosis Date   CHF (congestive heart failure) (HCC)    COPD (chronic obstructive pulmonary disease) (HCC)    Coronary artery disease    Hypertension     Past Surgical History:  Procedure Laterality Date   CARDIAC DEFIBRILLATOR PLACEMENT         Scheduled Meds:  Continuous Infusions:  PRN Meds:   Allergies:   Allergies[1]  Social History:   Social History   Socioeconomic History   Marital status: Married    Spouse name: Not on file   Number of children: Not on file   Years of education: Not on file   Highest education level:  Not on file  Occupational History   Not on file  Tobacco Use   Smoking status: Former    Current packs/day: 0.00    Types: Cigarettes    Quit date: 2015    Years since quitting: 11.0   Smokeless tobacco: Never   Tobacco comments:    Started smoking at 73 years old    Smoked 1 PPD at her heaviest    Quit smoking in 2015  Vaping Use   Vaping status: Never Used  Substance and Sexual Activity   Alcohol use: No   Drug use: No   Sexual activity: Not on file  Other Topics Concern   Not  on file  Social History Narrative   Not on file   Social Drivers of Health   Tobacco Use: Medium Risk (08/12/2024)   Patient History    Smoking Tobacco Use: Former    Smokeless Tobacco Use: Never    Passive Exposure: Not on Actuary Strain: Not on file  Food Insecurity: Not on file  Transportation Needs: Not on file  Physical Activity: Not on file  Stress: Not on file  Social Connections: Not on file  Intimate Partner Violence: Not on file  Depression (PHQ2-9): Not on file  Alcohol Screen: Not on file  Housing: Not on file  Utilities: Not on file  Health Literacy: Not on file    Family History:    Family History  Adopted: Yes     ROS:  Please see the history of present illness.   Physical Exam/Data: Vitals:   08/12/24 1216 08/12/24 1230 08/12/24 1300 08/12/24 1345  BP:  (!) 128/113 120/75 122/63  Pulse:  (!) 102 69 84  Resp:  15 19 17   Temp:      TempSrc:      SpO2: 95% 96% 95% (!) 76%   No intake or output data in the 24 hours ending 08/12/24 1418    08/12/2024    9:26 AM 01/30/2024    1:16 PM 01/22/2024    3:03 PM  Last 3 Weights  Weight (lbs) 314 lb 6.4 oz 309 lb 309 lb 4 oz  Weight (kg) 142.611 kg 140.161 kg 140.275 kg     There is no height or weight on file to calculate BMI.  General:  Well nourished, well developed, in no acute distress HEENT: normal Neck: Difficult to assess JVD due to body habitus Vascular: No carotid bruits; Distal pulses 2+ bilaterally Cardiac:  RRR; normal S1, S2; no murmur Lungs:  clear to auscultation bilaterally, no wheezing, rhonchi or rales  Abd: soft, nontender, no hepatomegaly  Ext: trace LE edema Skin: warm and dry  Psych:  Normal affect   EKG:  The EKG was personally reviewed and demonstrates:  ventricular paced rhythm with intermittent PVCs, rate 82 bpm Telemetry:  Telemetry was personally reviewed and demonstrates:  ventricular paced rhythm with intermittent PVCs and underlying complete heart  block, rate ~70 bpm  Relevant CV Studies:  04/2023 Echo complete 1. Left ventricular ejection fraction, by estimation, is 35 to 40%. The  left ventricle has moderately decreased function. The left ventricle  demonstrates global hypokinesis. The left ventricular internal cavity size  was mildly dilated. There is mild  left ventricular hypertrophy. Left ventricular diastolic parameters are  consistent with Grade I diastolic dysfunction (impaired relaxation).   2. Right ventricular systolic function is mildly reduced. The right  ventricular size is normal.   3. Left atrial size was mildly dilated.  4. The mitral valve is normal in structure. Mild mitral valve  regurgitation. No evidence of mitral stenosis.   5. Tricuspid valve regurgitation is mild to moderate.   6. The aortic valve has an indeterminant number of cusps. Aortic valve  regurgitation is mild. No aortic stenosis is present.   7. The inferior vena cava is normal in size with greater than 50%  respiratory variability, suggesting right atrial pressure of 3 mmHg.   Laboratory Data: High Sensitivity Troponin:  No results for input(s): TROPONINIHS in the last 720 hours.  Recent Labs  Lab 08/12/24 1116  TRNPT 17      Chemistry Recent Labs  Lab 08/12/24 1116  NA 138  K 4.1  CL 103  CO2 22  GLUCOSE 111*  BUN 21  CREATININE 0.99  CALCIUM 10.1  GFRNONAA 60*  ANIONGAP 13    No results for input(s): PROT, ALBUMIN, AST, ALT, ALKPHOS, BILITOT in the last 168 hours. Lipids No results for input(s): CHOL, TRIG, HDL, LABVLDL, LDLCALC, CHOLHDL in the last 168 hours.  Hematology Recent Labs  Lab 08/12/24 1116  WBC 7.1  RBC 4.54  HGB 13.8  HCT 41.1  MCV 90.5  MCH 30.4  MCHC 33.6  RDW 14.0  PLT 224   Thyroid No results for input(s): TSH, FREET4 in the last 168 hours.  BNP Recent Labs  Lab 08/12/24 1116  PROBNP 1,068.0*    DDimer No results for input(s): DDIMER in the last 168  hours.  Radiology/Studies:  DG Chest 2 View Result Date: 08/12/2024 CLINICAL DATA:  Chest pain.  Shortness of breath. EXAM: CHEST - 2 VIEW COMPARISON:  May 30, 2017. FINDINGS: Stable cardiomegaly. Left-sided defibrillator is unchanged. Both lungs are clear. The visualized skeletal structures are unremarkable. IMPRESSION: No active cardiopulmonary disease. Electronically Signed   By: Lynwood Landy Raddle M.D.   On: 08/12/2024 13:20   CUP PACEART INCLINIC DEVICE CHECK Result Date: 08/12/2024 Abnormal in-clinic _single__ chamber ICD check. Presenting Rhythm: _VP__ . Routine testing was performed. Threshold stable. RV integrity warning for elevated impedence, high rate NS episodes. VF episode 12/30, appears appropriate. HV therapy x 1 with clean break to VS/VP. See OV Note for further discussion. Estimated longevity _19 months___ . Pt is not enrolled in remote follow-up. CANDIE Needle, NP    Assessment and Plan:  Nonischemic cardiomyopathy s/p ICD RV lead integrity warning VF s/p ICD shock Heart block - Presenting after referral from EP clinic for VF with ICD shock and concern for RV lead integrity - Patient reports chest pain around 2 AM this morning when she was pushing her bed when she suddenly fell and loss control of her bladder - Device interrogation revealed an episode of VF with HV therapy converting to VS rhythm - EKG and telemetry show ventricular paced rhythm with intermittent PVCs and underlying complete heart block - K and mag wnl - Echo ordered - Recommend proceeding with R/LHC to assess for ischemic cause of VF and to assess volume status, see below - Discussed with EP colleagues who recommend eventual transfer to [facility] for device extraction and CRT-D implantation  Acute on chronic HFrEF - Most recent echo 04/2023 with EF 35-40% and global hypokinesis - ProBNP mildly elevated on admission in the setting of noncompliance with outpatient diuretic regimen for several weeks -  Volume status is difficult to assess due to body habitus, although she does not appear grossly volume overloaded - Echo ordered as above - Will likely require IV diuresis pending results  from [facility]  - Monitor kidney function, strict I/Os, and daily weights with diuresis   Informed Consent   Shared Decision Making/Informed Consent The risks, including but not limited to, [bleeding or vascular complications (1 in 500), pneumothorax (1 in 1600), arrhythmia (1 in 1000) and death (1 in 5000)], benefits (diagnostic support and/or management of heart failure, pulmonary hypertension) and alternatives of a right heart catheterization were discussed in detail with Penny Munoz and she is willing to proceed. The risks [stroke (1 in 1000), death (1 in 1000), kidney failure [usually temporary] (1 in 500), bleeding (1 in 200), allergic reaction [possibly serious] (1 in 200)], benefits (diagnostic support and management of coronary artery disease) and alternatives of a cardiac catheterization were discussed in detail with Penny Munoz and she is willing to proceed.      For questions or updates, please contact [facility] Please consult [email or web address] for contact info under      Signed, Lesley LITTIE Maffucci, PA-C  08/12/2024 2:18 PM     [1]  Allergies Allergen Reactions   Hydrocodone  Other (See Comments)    took one pill can't tolerate took one pill can't tolerate    Oxycodone Hcl Shortness Of Breath   Ampicillin     Other reaction(s): Angioedema Other reaction(s): Angioedema    Amoxicillin Rash   Penicillins Anxiety   "

## 2024-08-12 NOTE — ED Provider Notes (Signed)
 "  [facility] Provider Note    Event Date/Time   First MD Initiated Contact with Patient 08/12/24 1157     (approximate)   History   Chest Pain and Pacemaker Problem   HPI  Penny Munoz is a 73 y.o. female CHF, COPD, CAD with ICD sent from cardiologist office for evaluation because of ICD firing last night.  Apparent concern for possible RV lead integrity issue     Physical Exam   Triage Vital Signs: ED Triage Vitals  Encounter Vitals Group     BP 08/12/24 1058 110/65     Girls Systolic BP Percentile --      Girls Diastolic BP Percentile --      Boys Systolic BP Percentile --      Boys Diastolic BP Percentile --      Pulse Rate 08/12/24 1058 71     Resp 08/12/24 1058 18     Temp 08/12/24 1058 98.2 F (36.8 C)     Temp Source 08/12/24 1058 Oral     SpO2 08/12/24 1058 91 %     Weight --      Height --      Head Circumference --      Peak Flow --      Pain Score 08/12/24 1112 0     Pain Loc --      Pain Education --      Exclude from Growth Chart --     Most recent vital signs: Vitals:   08/12/24 1300 08/12/24 1345  BP: 120/75 122/63  Pulse: 69 84  Resp: 19 17  Temp:    SpO2: 95% (!) 76%     General: Awake, no distress.  CV:  Good peripheral perfusion.  Resp:  Mild tachypnea, bibasilar Abd:  No distention.  Other:  Mild lower extremity edema   ED Results / Procedures / Treatments   Labs (all labs ordered are listed, but only abnormal results are displayed) Labs Reviewed  BASIC METABOLIC PANEL WITH GFR - Abnormal; Notable for the following components:      Result Value   Glucose, Bld 111 (*)    GFR, Estimated 60 (*)    All other components within normal limits  PRO BRAIN NATRIURETIC PEPTIDE - Abnormal; Notable for the following components:   Pro Brain Natriuretic Peptide 1,068.0 (*)    All other components within normal limits  CBC  LACTIC ACID, PLASMA  TROPONIN T, HIGH SENSITIVITY  TROPONIN T, HIGH  SENSITIVITY     EKG  ED ECG REPORT I, Lamar Price, the attending physician, personally viewed and interpreted this ECG.  Date: 08/12/2024  Rhythm: Abnormal QRS Axis: Abnormal Intervals: Abnormal ST/T Wave abnormalities: Nonspecific changes Narrative Interpretation: Possible heart block    RADIOLOGY    PROCEDURES:  Critical Care performed:     Procedures   MEDICATIONS ORDERED IN ED: Medications - No data to display   IMPRESSION / MDM / ASSESSMENT AND PLAN / ED COURSE  I reviewed the triage vital signs and the nursing notes. Patient's presentation is most consistent with acute presentation with potential threat to life or bodily function.  Patient presents with ICD firing, concern for right ventricle lead integrity issue.  Sent in by cardiology for diuresis, admission, likely heart catheterization to evaluate further  Lab work reviewed overall unremarkable, normal high sensitive troponin  Patient seen by Dr. Mady of cardiology, recommends admission here for heart catheterization, diuresis      FINAL CLINICAL  IMPRESSION(S) / ED DIAGNOSES   Final diagnoses:  Malfunction of implantable defibrillator ventricular (ICD) lead     Rx / DC Orders   ED Discharge Orders     None        Note:  This document was prepared using Dragon voice recognition software and may include unintentional dictation errors.   Arlander [place], MD 08/12/24 1422  "

## 2024-08-12 NOTE — Progress Notes (Signed)
 PHARMACIST - PHYSICIAN COMMUNICATION  CONCERNING:  Enoxaparin (Lovenox) for DVT Prophylaxis    RECOMMENDATION: Patient was prescribed enoxaprin 40mg  q24 hours for VTE prophylaxis.   There were no vitals filed for this visit.  There is no height or weight on file to calculate BMI.  Estimated Creatinine Clearance: 77.4 mL/min (by C-G formula based on SCr of 0.99 mg/dL).   Based on [facility] policy patient is candidate for enoxaparin 0.5mg /kg TBW SQ every 24 hours based on BMI being >30.   DESCRIPTION: Pharmacy has adjusted enoxaparin dose per [facility] policy.  Patient is now receiving enoxaparin 72.5 mg every 24 hours   Ransom Blanch PGY-1 Pharmacy Resident  [place] [facility]  08/12/2024 6:37 PM

## 2024-08-12 NOTE — ED Notes (Addendum)
 Pt reports SOB and chest discomfort for the past 2 weeks. Pt reports SOB and chest discomfort worsened last night. Pt states that she stopped taking her water pill, 3 weeks ago. Pt reports she has a defibrillator since 2015. Pt A&Ox4.

## 2024-08-12 NOTE — ED Triage Notes (Signed)
 Pt presents to the ED via POV from home with family. Pt reports that her pacemaker has been firing. Pt reports intermittent chest discomfort and SHOB. Pt A&Ox4.

## 2024-08-12 NOTE — ED Notes (Signed)
 Called ICU to make aware that pt just left for cath lab and they would receive report from PACU after surgery.

## 2024-08-12 NOTE — H&P (Addendum)
 " History and Physical    Penny Munoz FMW:[id number] DOB: February 13, 1951 DOA: 08/12/2024  DOS: the patient was seen and examined on 08/12/2024  PCP: [place] HERO, MD   Patient coming from: EP Clinic  I have personally briefly reviewed patient's old medical records in [facility] and CareEverywhere  HPI:   Penny Munoz is a 73 y.o. year old female with medical history of hypertension, hyperlipidemia, COPD, CHF 2/2 to [facility] s/p ICD placement presenting from EP clinic after going there for acute visit for ICD device alert.  EKG there showed complete heart block with ventricular paced rhythms and device interrogation showed patient was in V-fib earlier and was shocked to sinus rhythm.  Given these findings she will instructed to come to the ED for further evaluation with likely left heart cath, TEE and likely device extraction. Pt states she felt her body going numb and she had urinary incontinence. She is unable to give further details into this. States no URI symptoms. No fevers or chills. EDP discussed case with cardiology who are planning for left heart cath today. On arrival to the ED patient was noted to be HDS stable.  Lab work and imaging obtained.  CBC unremarkable.  BMP grossly unremarkable.  Troponin normal with repeat pending.  proBNP elevated at 1068.  Magnesium normal.  Lactic acid normal.  Chest x-ray without any acute findings.  Given need for further care, [facility] contacted for admission.  Review of Systems: As mentioned in the history of present illness. All other systems reviewed and are negative.   Past Medical History:  Diagnosis Date   CHF (congestive heart failure) (HCC)    COPD (chronic obstructive pulmonary disease) (HCC)    Coronary artery disease    Hypertension     Past Surgical History:  Procedure Laterality Date   CARDIAC DEFIBRILLATOR PLACEMENT       Allergies[1]  Family History  Adopted: Yes    Prior to Admission medications   Medication Sig Start Date End Date Taking? Authorizing Provider  acetaminophen  (TYLENOL ) 325 MG tablet Take by mouth as needed.  03/09/16  Yes [provider]  albuterol (VENTOLIN HFA) 108 (90 Base) MCG/ACT inhaler Inhale into the lungs as needed.   Yes [provider]  allopurinol (ZYLOPRIM) 300 MG tablet Take 300 mg by mouth daily.   Yes [provider]  carvedilol  (COREG ) 12.5 MG tablet Take 1 tablet (12.5 mg total) by mouth 2 (two) times daily with a meal. 02/20/24  Yes Darron Deatrice LABOR, MD  cetirizine (ZYRTEC) 10 MG tablet Take 10 mg by mouth daily. 04/05/21  Yes [provider]  citalopram (CELEXA) 20 MG tablet Take 20 mg by mouth daily. 02/07/21  Yes [provider]  Fluticasone-Umeclidin-Vilant (TRELEGY ELLIPTA) 100-62.5-25 MCG/ACT AEPB Inhale 1 Inhalation into the lungs daily.   Yes [provider]  furosemide  (LASIX ) 40 MG tablet Take 1 tablet (40 mg total) by mouth daily. 03/03/22  Yes Darron Deatrice LABOR, MD  GARLIC PO Take by mouth daily.   Yes [provider]  Multiple Vitamins-Minerals (MULTIVITAMIN WITH MINERALS) tablet Take 1 tablet by mouth daily.   Yes [provider]  omeprazole (PRILOSEC) 20 MG capsule Take 20 mg by mouth daily.   Yes [provider]  spironolactone  (ALDACTONE ) 25 MG tablet Take 1 tablet (25 mg total) by mouth daily. 02/20/24  Yes Darron Deatrice LABOR, MD  Tiotropium Bromide Monohydrate  (SPIRIVA  RESPIMAT) 1.25 MCG/ACT AERS Inhale 2 puffs into the lungs  daily. 01/30/24  Yes Assaker, [name], MD  fluticasone (FLONASE) 50 MCG/ACT nasal spray Place 1 spray into both nostrils daily. Patient not taking: No sig reported 11/29/20   [provider]  HYDROcodone -acetaminophen  (NORCO/VICODIN) 5-325 MG tablet Take 1 tablet by mouth every 6 (six) hours as needed. Patient not taking: Reported on 08/12/2024 07/18/17   Saunders Shona CROME, PA-C  Multiple Minerals (CALCIUM/MAGNESIUM/ZINC PO) Take by  mouth. Takes 1 tablet every 3 days    [provider]  sacubitril -valsartan  (ENTRESTO ) 24-26 MG TAKE 1 TABLET TWICE A DAY 05/29/24   Darron Deatrice LABOR, MD  VOLTAREN 1 % GEL Apply 1 a small amount to affected area four times a day Patient not taking: No sig reported 04/19/21   [provider]    Social History:  reports that she quit smoking about 11 years ago. Her smoking use included cigarettes. She has never used smokeless tobacco. She reports that she does not drink alcohol and does not use drugs. Lives with husband, does not smoke or drink alcohol.    Physical Exam: Vitals:   08/12/24 1430 08/12/24 1509 08/12/24 1530 08/12/24 1545  BP: 128/74  (!) 113/54 135/68  Pulse: 75  (!) 41 (!) 48  Resp: (!) 9  12 17   Temp:  97.8 F (36.6 C)    TempSrc:  Oral    SpO2: 94%  96% 94%    Gen: NAD HENT: NCAT CV: normal heart sounds Lung: bibasilar rales Abd: No TTP, normal bowel sounds MSK: No asymmetry, good bulk and tone Neuro: alert and oriented   Labs on Admission: I have personally reviewed following labs and imaging studies  CBC: Recent Labs  Lab 08/12/24 1116  WBC 7.1  HGB 13.8  HCT 41.1  MCV 90.5  PLT 224   Basic Metabolic Panel: Recent Labs  Lab 08/12/24 1116  NA 138  K 4.1  CL 103  CO2 22  GLUCOSE 111*  BUN 21  CREATININE 0.99  CALCIUM 10.1  MG 2.1   GFR: Estimated Creatinine Clearance: 77.4 mL/min (by C-G formula based on SCr of 0.99 mg/dL). Liver Function Tests: No results for input(s): AST, ALT, ALKPHOS, BILITOT, PROT, ALBUMIN in the last 168 hours. No results for input(s): LIPASE, AMYLASE in the last 168 hours. No results for input(s): AMMONIA in the last 168 hours. Coagulation Profile: Recent Labs  Lab 08/12/24 1116  INR 1.1   Cardiac Enzymes: No results for input(s): CKTOTAL, CKMB, CKMBINDEX, TROPONINI, TROPONINIHS in the last 168 hours. BNP (last 3 results) No results for input(s): BNP in the  last 8760 hours. HbA1C: No results for input(s): HGBA1C in the last 72 hours. CBG: No results for input(s): GLUCAP in the last 168 hours. Lipid Profile: No results for input(s): CHOL, HDL, LDLCALC, TRIG, CHOLHDL, LDLDIRECT in the last 72 hours. Thyroid Function Tests: No results for input(s): TSH, T4TOTAL, FREET4, T3FREE, THYROIDAB in the last 72 hours. Anemia Panel: No results for input(s): VITAMINB12, FOLATE, FERRITIN, TIBC, IRON, RETICCTPCT in the last 72 hours. Urine analysis: No results found for: COLORURINE, APPEARANCEUR, LABSPEC, PHURINE, GLUCOSEU, HGBUR, BILIRUBINUR, KETONESUR, PROTEINUR, UROBILINOGEN, NITRITE, LEUKOCYTESUR  Radiological Exams on Admission: I have personally reviewed images DG Chest 2 View Result Date: 08/12/2024 CLINICAL DATA:  Chest pain.  Shortness of breath. EXAM: CHEST - 2 VIEW COMPARISON:  May 30, 2017. FINDINGS: Stable cardiomegaly. Left-sided defibrillator is unchanged. Both lungs are clear. The visualized skeletal structures are unremarkable. IMPRESSION: No active cardiopulmonary disease. Electronically Signed   By: Lynwood  Landy Raddle M.D.   On: 08/12/2024 13:20   CUP PACEART INCLINIC DEVICE CHECK Result Date: 08/12/2024 Abnormal in-clinic _single__ chamber ICD check. Presenting Rhythm: _VP__ . Routine testing was performed. Threshold stable. RV integrity warning for elevated impedence, high rate NS episodes. VF episode 12/30, appears appropriate. HV therapy x 1 with clean break to VS/VP. See OV Note for further discussion. Estimated longevity _19 months___ . Pt is not enrolled in remote follow-up. CANDIE Needle, NP   EKG: My personal interpretation of EKG shows: Ventricular paced rhythm with premature beats.  No acute ST changes    Assessment/Plan Principal Problem:   Malfunction of implantable defibrillator ventricular (ICD) lead Active Problems:   GAD (generalized anxiety disorder)    Asthma   Bipolar disorder (HCC)   CHF (congestive heart failure) (HCC)   Chronic obstructive pulmonary disease (HCC)   GERD (gastroesophageal reflux disease)   Hypertension   ICD (implantable cardioverter-defibrillator) in place   Patient with nonischemic cardiomyopathy with EF 35% status post ICD placement with ICD malfunction admitted for further workup and interventions.  Cardiology consulted and planning for left heart catheter today.  Per outpatient EP notes patient will likely need device extraction as well.  Will defer this to cardiology.  Appreciate cardiology involvement in this care and their recommendations.  Will keep patient n.p.o. for left heart cath later today.  Given elevated BNP, and volume overload and exam will diurese patient with IV Lasix .  Will defer this to after heart catheterization and we will have further admissions regarding right heart pressures.  Hypertension: Will continue home Entresto , spironolactone  post cath.  Holding home Coreg  given complete heart block noted on EKG.  Hyperlipidemia: Will get lipid panel as pt is not on statin.   COPD: Not in exacerbation.  Continue home inhalers.  Bipolar/GAD: Continue home med.  GERD: Continue home PPI.  VTE prophylaxis: SQ Heparin   Diet: NPO Code Status:  Full Code Telemetry:  Admission status: Inpatient, Progressive Patient is from: Home Anticipated d/c is to: Home Anticipated d/c is in: 3-4 days   Family Communication: Updated at bedside  Consults called: Cardiology   Severity of Illness: The appropriate patient status for this patient is INPATIENT. Inpatient status is judged to be reasonable and necessary in order to provide the required intensity of service to ensure the patient's safety. The patient's presenting symptoms, physical exam findings, and initial radiographic and laboratory data in the context of their chronic comorbidities is felt to place them at high risk for further clinical  deterioration. Furthermore, it is not anticipated that the patient will be medically stable for discharge from the hospital within 2 midnights of admission.   * I certify that at the point of admission it is my clinical judgment that the patient will require inpatient hospital care spanning beyond 2 midnights from the point of admission due to high intensity of service, high risk for further deterioration and high frequency of surveillance required.DEWAINE Morene Bathe, MD Penny DEL. [facility]     [1]  Allergies Allergen Reactions   Hydrocodone  Other (See Comments)    took one pill can't tolerate took one pill can't tolerate    Oxycodone Hcl Shortness Of Breath   Ampicillin     Other reaction(s): Angioedema Other reaction(s): Angioedema    Amoxicillin Rash   Penicillins Anxiety   "

## 2024-08-12 NOTE — ED Notes (Signed)
 Called CCMD to add pt to monitoring.

## 2024-08-13 ENCOUNTER — Inpatient Hospital Stay (HOSPITAL_BASED_OUTPATIENT_CLINIC_OR_DEPARTMENT_OTHER)
Admit: 2024-08-13 | Discharge: 2024-08-13 | Disposition: A | Discharge status: Home / Self Care | Attending: Internal Medicine | Admitting: Internal Medicine

## 2024-08-13 ENCOUNTER — Encounter: Payer: Self-pay | Admitting: Internal Medicine

## 2024-08-13 ENCOUNTER — Other Ambulatory Visit: Payer: Self-pay

## 2024-08-13 ENCOUNTER — Inpatient Hospital Stay (HOSPITAL_COMMUNITY)
Admission: RE | Admit: 2024-08-13 | Discharge: 2024-08-17 | Disposition: A | Source: Ambulatory Visit | Attending: Internal Medicine | Admitting: Internal Medicine

## 2024-08-13 DIAGNOSIS — I493 Ventricular premature depolarization: Secondary | ICD-10-CM | POA: Diagnosis present

## 2024-08-13 DIAGNOSIS — T82110A Breakdown (mechanical) of cardiac electrode, initial encounter: Principal | ICD-10-CM | POA: Diagnosis present

## 2024-08-13 DIAGNOSIS — Z880 Allergy status to penicillin: Secondary | ICD-10-CM | POA: Diagnosis not present

## 2024-08-13 DIAGNOSIS — Z87891 Personal history of nicotine dependence: Secondary | ICD-10-CM

## 2024-08-13 DIAGNOSIS — I447 Left bundle-branch block, unspecified: Secondary | ICD-10-CM | POA: Diagnosis present

## 2024-08-13 DIAGNOSIS — T82198A Other mechanical complication of other cardiac electronic device, initial encounter: Secondary | ICD-10-CM | POA: Diagnosis not present

## 2024-08-13 DIAGNOSIS — J4489 Other specified chronic obstructive pulmonary disease: Secondary | ICD-10-CM | POA: Diagnosis present

## 2024-08-13 DIAGNOSIS — R001 Bradycardia, unspecified: Secondary | ICD-10-CM | POA: Diagnosis present

## 2024-08-13 DIAGNOSIS — R079 Chest pain, unspecified: Secondary | ICD-10-CM | POA: Diagnosis not present

## 2024-08-13 DIAGNOSIS — Z4502 Encounter for adjustment and management of automatic implantable cardiac defibrillator: Secondary | ICD-10-CM

## 2024-08-13 DIAGNOSIS — I4901 Ventricular fibrillation: Secondary | ICD-10-CM

## 2024-08-13 DIAGNOSIS — T82120A Displacement of cardiac electrode, initial encounter: Secondary | ICD-10-CM | POA: Diagnosis not present

## 2024-08-13 DIAGNOSIS — I2510 Atherosclerotic heart disease of native coronary artery without angina pectoris: Secondary | ICD-10-CM | POA: Diagnosis present

## 2024-08-13 DIAGNOSIS — F319 Bipolar disorder, unspecified: Secondary | ICD-10-CM | POA: Diagnosis present

## 2024-08-13 DIAGNOSIS — I442 Atrioventricular block, complete: Secondary | ICD-10-CM

## 2024-08-13 DIAGNOSIS — T82118A Breakdown (mechanical) of other cardiac electronic device, initial encounter: Secondary | ICD-10-CM | POA: Diagnosis present

## 2024-08-13 DIAGNOSIS — T501X6A Underdosing of loop [high-ceiling] diuretics, initial encounter: Secondary | ICD-10-CM | POA: Diagnosis present

## 2024-08-13 DIAGNOSIS — I110 Hypertensive heart disease with heart failure: Secondary | ICD-10-CM | POA: Diagnosis present

## 2024-08-13 DIAGNOSIS — I5032 Chronic diastolic (congestive) heart failure: Secondary | ICD-10-CM

## 2024-08-13 DIAGNOSIS — Z885 Allergy status to narcotic agent status: Secondary | ICD-10-CM | POA: Diagnosis not present

## 2024-08-13 DIAGNOSIS — Z79899 Other long term (current) drug therapy: Secondary | ICD-10-CM | POA: Diagnosis not present

## 2024-08-13 DIAGNOSIS — R32 Unspecified urinary incontinence: Secondary | ICD-10-CM | POA: Diagnosis present

## 2024-08-13 DIAGNOSIS — I272 Pulmonary hypertension, unspecified: Secondary | ICD-10-CM | POA: Diagnosis present

## 2024-08-13 DIAGNOSIS — I5023 Acute on chronic systolic (congestive) heart failure: Secondary | ICD-10-CM

## 2024-08-13 DIAGNOSIS — I5022 Chronic systolic (congestive) heart failure: Secondary | ICD-10-CM | POA: Diagnosis not present

## 2024-08-13 DIAGNOSIS — D1779 Benign lipomatous neoplasm of other sites: Secondary | ICD-10-CM | POA: Diagnosis present

## 2024-08-13 DIAGNOSIS — E785 Hyperlipidemia, unspecified: Secondary | ICD-10-CM | POA: Diagnosis present

## 2024-08-13 DIAGNOSIS — Y712 Prosthetic and other implants, materials and accessory cardiovascular devices associated with adverse incidents: Secondary | ICD-10-CM | POA: Diagnosis present

## 2024-08-13 DIAGNOSIS — T82119A Breakdown (mechanical) of unspecified cardiac electronic device, initial encounter: Secondary | ICD-10-CM | POA: Diagnosis present

## 2024-08-13 DIAGNOSIS — I428 Other cardiomyopathies: Secondary | ICD-10-CM | POA: Diagnosis present

## 2024-08-13 DIAGNOSIS — Z6841 Body Mass Index (BMI) 40.0 and over, adult: Secondary | ICD-10-CM | POA: Diagnosis not present

## 2024-08-13 DIAGNOSIS — F411 Generalized anxiety disorder: Secondary | ICD-10-CM | POA: Diagnosis present

## 2024-08-13 DIAGNOSIS — J432 Centrilobular emphysema: Secondary | ICD-10-CM

## 2024-08-13 DIAGNOSIS — G4733 Obstructive sleep apnea (adult) (pediatric): Secondary | ICD-10-CM | POA: Diagnosis present

## 2024-08-13 DIAGNOSIS — Z91148 Patient's other noncompliance with medication regimen for other reason: Secondary | ICD-10-CM | POA: Diagnosis not present

## 2024-08-13 DIAGNOSIS — J449 Chronic obstructive pulmonary disease, unspecified: Secondary | ICD-10-CM | POA: Diagnosis present

## 2024-08-13 DIAGNOSIS — K219 Gastro-esophageal reflux disease without esophagitis: Secondary | ICD-10-CM | POA: Diagnosis present

## 2024-08-13 DIAGNOSIS — Z95810 Presence of automatic (implantable) cardiac defibrillator: Secondary | ICD-10-CM | POA: Diagnosis not present

## 2024-08-13 DIAGNOSIS — R0602 Shortness of breath: Secondary | ICD-10-CM | POA: Diagnosis not present

## 2024-08-13 DIAGNOSIS — Z91128 Patient's intentional underdosing of medication regimen for other reason: Secondary | ICD-10-CM | POA: Diagnosis not present

## 2024-08-13 LAB — CBC
HCT: 39.6 % (ref 36.0–46.0)
Hemoglobin: 13.2 g/dL (ref 12.0–15.0)
MCH: 30.3 pg (ref 26.0–34.0)
MCHC: 33.3 g/dL (ref 30.0–36.0)
MCV: 90.8 fL (ref 80.0–100.0)
Platelets: 215 K/uL (ref 150–400)
RBC: 4.36 MIL/uL (ref 3.87–5.11)
RDW: 14.3 % (ref 11.5–15.5)
WBC: 7.7 K/uL (ref 4.0–10.5)
nRBC: 0 % (ref 0.0–0.2)

## 2024-08-13 LAB — BASIC METABOLIC PANEL WITH GFR
Anion gap: 13 (ref 5–15)
BUN: 20 mg/dL (ref 8–23)
CO2: 22 mmol/L (ref 22–32)
Calcium: 9.5 mg/dL (ref 8.9–10.3)
Chloride: 104 mmol/L (ref 98–111)
Creatinine, Ser: 0.83 mg/dL (ref 0.44–1.00)
GFR, Estimated: >60 mL/min
Glucose, Bld: 99 mg/dL (ref 70–99)
Potassium: 3.5 mmol/L (ref 3.5–5.1)
Sodium: 139 mmol/L (ref 135–145)

## 2024-08-13 LAB — HEPATIC FUNCTION PANEL
ALT: 16 U/L (ref 0–44)
AST: 17 U/L (ref 15–41)
Albumin: 3.9 g/dL (ref 3.5–5.0)
Alkaline Phosphatase: 104 U/L (ref 38–126)
Bilirubin, Direct: 0.2 mg/dL (ref 0.0–0.2)
Indirect Bilirubin: 0.3 mg/dL (ref 0.3–0.9)
Total Bilirubin: 0.4 mg/dL (ref 0.0–1.2)
Total Protein: 6.9 g/dL (ref 6.5–8.1)

## 2024-08-13 LAB — ECHOCARDIOGRAM COMPLETE
Height: 68 in
S' Lateral: 3.5 cm
Weight: 5061.76 [oz_av]

## 2024-08-13 LAB — TSH: TSH: 1.29 u[IU]/mL (ref 0.350–4.500)

## 2024-08-13 LAB — T4, FREE: Free T4: 1.09 ng/dL (ref 0.80–2.00)

## 2024-08-13 LAB — GLUCOSE, CAPILLARY: Glucose-Capillary: 111 mg/dL — ABNORMAL HIGH (ref 70–99)

## 2024-08-13 MED ORDER — ONDANSETRON HCL 4 MG/2ML IJ SOLN: 4.0000 mg | Freq: Four times a day (QID) | INTRAMUSCULAR | Status: DC | PRN

## 2024-08-13 MED ORDER — CITALOPRAM HYDROBROMIDE 20 MG PO TABS
20.0000 mg | ORAL_TABLET | Freq: Every day | ORAL | Status: DC
  Administered 2024-08-14 – 2024-08-17 (×4): 20 mg via ORAL
  Filled 2024-08-13 (×4): qty 1

## 2024-08-13 MED ORDER — POTASSIUM CHLORIDE CRYS ER 20 MEQ PO TBCR
40.0000 meq | EXTENDED_RELEASE_TABLET | Freq: Once | ORAL | Status: AC
  Administered 2024-08-13: 40 meq via ORAL
  Filled 2024-08-13: qty 2

## 2024-08-13 MED ORDER — FUROSEMIDE 10 MG/ML IJ SOLN
40.0000 mg | Freq: Once | INTRAMUSCULAR | Status: AC
  Administered 2024-08-13: 40 mg via INTRAVENOUS
  Filled 2024-08-13: qty 4

## 2024-08-13 MED ORDER — AMIODARONE HCL IN DEXTROSE 360-4.14 MG/200ML-% IV SOLN: 30.0000 mg/h | INTRAVENOUS | Status: DC

## 2024-08-13 MED ORDER — ALLOPURINOL 300 MG PO TABS
300.0000 mg | ORAL_TABLET | Freq: Every day | ORAL | Status: DC
  Administered 2024-08-14 – 2024-08-17 (×4): 300 mg via ORAL
  Filled 2024-08-13 (×4): qty 1

## 2024-08-13 MED ORDER — POLYETHYLENE GLYCOL 3350 17 G PO PACK: 17.0000 g | PACK | Freq: Every day | ORAL | Status: DC | PRN

## 2024-08-13 MED ORDER — FUROSEMIDE 10 MG/ML IJ SOLN
40.0000 mg | Freq: Two times a day (BID) | INTRAMUSCULAR | Status: AC
  Administered 2024-08-13 – 2024-08-14 (×2): 40 mg via INTRAVENOUS
  Filled 2024-08-13 (×2): qty 4

## 2024-08-13 MED ORDER — SPIRONOLACTONE 25 MG PO TABS
25.0000 mg | ORAL_TABLET | Freq: Every day | ORAL | Status: DC
  Administered 2024-08-14 – 2024-08-17 (×4): 25 mg via ORAL
  Filled 2024-08-13 (×4): qty 1

## 2024-08-13 MED ORDER — SACUBITRIL-VALSARTAN 24-26 MG PO TABS
1.0000 | ORAL_TABLET | Freq: Two times a day (BID) | ORAL | Status: DC
  Administered 2024-08-13 – 2024-08-17 (×8): 1 via ORAL
  Filled 2024-08-13 (×8): qty 1

## 2024-08-13 MED ORDER — ACETAMINOPHEN 650 MG RE SUPP: 650.0000 mg | Freq: Four times a day (QID) | RECTAL | Status: DC | PRN

## 2024-08-13 MED ORDER — ONDANSETRON HCL 4 MG PO TABS: 4.0000 mg | ORAL_TABLET | Freq: Four times a day (QID) | ORAL | Status: DC | PRN

## 2024-08-13 MED ORDER — IPRATROPIUM-ALBUTEROL 0.5-2.5 (3) MG/3ML IN SOLN
3.0000 mL | RESPIRATORY_TRACT | Status: DC | PRN
  Administered 2024-08-15: 3 mL via RESPIRATORY_TRACT
  Filled 2024-08-13 (×2): qty 3

## 2024-08-13 MED ORDER — AMIODARONE LOAD VIA INFUSION
150.0000 mg | Freq: Once | INTRAVENOUS | Status: AC
  Administered 2024-08-13: 150 mg via INTRAVENOUS
  Filled 2024-08-13: qty 83.34

## 2024-08-13 MED ORDER — ACETAMINOPHEN 325 MG PO TABS
650.0000 mg | ORAL_TABLET | Freq: Four times a day (QID) | ORAL | Status: DC | PRN
  Administered 2024-08-15: 650 mg via ORAL
  Filled 2024-08-13: qty 2

## 2024-08-13 MED ORDER — AMIODARONE HCL IN DEXTROSE 360-4.14 MG/200ML-% IV SOLN
60.0000 mg/h | INTRAVENOUS | Status: AC
  Administered 2024-08-13 (×2): 60 mg/h via INTRAVENOUS
  Filled 2024-08-13 (×2): qty 200

## 2024-08-13 MED ORDER — AMIODARONE HCL IN DEXTROSE 360-4.14 MG/200ML-% IV SOLN
60.0000 mg/h | INTRAVENOUS | Status: AC
  Administered 2024-08-13: 60 mg/h via INTRAVENOUS
  Filled 2024-08-13 (×2): qty 200

## 2024-08-13 MED ORDER — AMIODARONE HCL IN DEXTROSE 360-4.14 MG/200ML-% IV SOLN
30.0000 mg/h | INTRAVENOUS | Status: DC
  Administered 2024-08-14 – 2024-08-15 (×3): 30 mg/h via INTRAVENOUS
  Filled 2024-08-13 (×2): qty 200

## 2024-08-13 MED ORDER — SPIRONOLACTONE 25 MG PO TABS
25.0000 mg | ORAL_TABLET | Freq: Every day | ORAL | Status: DC
  Administered 2024-08-13: 25 mg via ORAL
  Filled 2024-08-13: qty 1

## 2024-08-13 NOTE — Discharge Summary (Signed)
 " Physician Discharge Summary   Patient: Penny Munoz MRN: [id number] DOB: 1950/12/20  Admit date:     08/12/2024  Discharge date: 08/13/2024  Discharge Physician: Ivan Vangie Pike   PCP: [place] HERO, MD   Recommendations at discharge:    Ensure Cardiology and EP are consulted for this patient.  Echocardiogram ordered but not completed at [facility]. Please order again if necessary.   Discharge Diagnoses: Principal Problem:   Malfunction of implantable defibrillator ventricular (ICD) lead Active Problems:   GAD (generalized anxiety disorder)   Asthma   Bipolar disorder (HCC)   CHF (congestive heart failure) (HCC)   Chronic obstructive pulmonary disease (HCC)   GERD (gastroesophageal reflux disease)   Hypertension   ICD (implantable cardioverter-defibrillator) in place   Acute on chronic HFrEF (heart failure with reduced ejection fraction) (HCC)   VF (ventricular fibrillation) (HCC)  Resolved Problems:   * No resolved hospital problems. *  Hospital Course: HPI Penny Munoz is a 73 y.o. year old female with medical history of hypertension, hyperlipidemia, COPD, CHF 2/2 to NICM s/p ICD placement presenting from EP clinic after going there for acute visit for ICD device alert.  EKG there showed complete heart block with ventricular paced rhythms and device interrogation showed patient was in V-fib earlier and was shocked to sinus rhythm. It showed concern for RV lead impedence. Given these findings she will instructed to come to the ED for further evaluation with likely left heart cath, TEE and likely device extraction. Pt states she felt her body going numb and she had urinary incontinence. She is unable to give further details into this. States no URI symptoms. No fevers or chills. States she stopped taking her lasix  around 4 weeks ago as she was already on a different fluid pill. EDP discussed case with cardiology who are planning for left heart cath today. On  arrival to the ED patient was noted to be HDS stable.  Lab work and imaging obtained.  CBC unremarkable.  BMP grossly unremarkable.  Troponin normal with repeat pending.  proBNP elevated at 1068.  Magnesium normal.  Lactic acid normal.  Chest x-ray without any acute findings.  Given need for further care, [facility] contacted for admission.    Assessment and Plan: Patient with nonischemic cardiomyopathy with EF 35% status post ICD placement with ICD malfunction admitted to [facility] and underwent cardiac catheterization did not show any angiographically significant coronary artery disease. Per outpatient EP notes patient will likely need device extraction as well.  EP requesting patient transfer to [facility] for further evaluation and likely device extraction given concern for RV lead impedance.  Discussed with cardiology and they are agreeable with this plan.  Cardiology recommendations post cath are listed below.  They have scheduled all cardiovascular medications to be started tomorrow. Cardiology recommendations post cath: Maintain net even to slightly negative fluid balance. Continue goal-directed medical therapy for nonischemic cardiomyopathy. Further management of VF and concern for ICD RV lead dysfunction per electrophysiology.  Hypertension: Will continue home Entresto , spironolactone  starting 12/31.  Holding Coreg  until cleared by cardiology given EKG showing complete heart block.   Hyperlipidemia: Lipid panel pending. Goal is <70 given CAD.   COPD: Not in exacerbation.  Continued home inhalers.   Bipolar/GAD: Continued home med.   GERD: Continued home PPI.  OSA on CPAP: nightly cpap ordered.      Consultants: Cardiology Procedures performed: Left and right heart cath Disposition: [facility] Diet recommendation: Heart healthy diet.  N.p.o.  after midnight on 12/30. DISCHARGE MEDICATION: Allergies as of 08/13/2024       Reactions   Hydrocodone  Other (See Comments)   took one  pill can't tolerate took one pill can't tolerate   Oxycodone Hcl Shortness Of Breath   Ampicillin    Other reaction(s): Angioedema Other reaction(s): Angioedema   Amoxicillin Rash   Penicillins Anxiety        Medication List     PAUSE taking these medications    carvedilol  12.5 MG tablet Wait to take this until your doctor or other care provider tells you to start again. Commonly known as: COREG  Take 1 tablet (12.5 mg total) by mouth 2 (two) times daily with a meal.       STOP taking these medications    acetaminophen  325 MG tablet Commonly known as: TYLENOL    HYDROcodone -acetaminophen  5-325 MG tablet Commonly known as: NORCO/VICODIN   Spiriva  Respimat 1.25 MCG/ACT Aers Generic drug: Tiotropium Bromide       TAKE these medications    albuterol 108 (90 Base) MCG/ACT inhaler Commonly known as: VENTOLIN HFA Inhale into the lungs as needed.   allopurinol 300 MG tablet Commonly known as: ZYLOPRIM Take 300 mg by mouth daily.   CALCIUM/MAGNESIUM/ZINC PO Take by mouth. Takes 1 tablet every 3 days   cetirizine 10 MG tablet Commonly known as: ZYRTEC Take 10 mg by mouth daily.   citalopram 20 MG tablet Commonly known as: CELEXA Take 20 mg by mouth daily.   fluticasone 50 MCG/ACT nasal spray Commonly known as: FLONASE Place 1 spray into both nostrils daily.   furosemide  40 MG tablet Commonly known as: LASIX  Take 1 tablet (40 mg total) by mouth daily.   GARLIC PO Take by mouth daily.   hydrALAZINE 20 MG/ML injection Commonly known as: APRESOLINE Inject 0.5 mLs (10 mg total) into the vein every 20 (twenty) minutes as needed (high blood pressure).   multivitamin with minerals tablet Take 1 tablet by mouth daily.   omeprazole 20 MG capsule Commonly known as: PRILOSEC Take 20 mg by mouth daily.   sacubitril -valsartan  24-26 MG Commonly known as: ENTRESTO  TAKE 1 TABLET TWICE A DAY   spironolactone  25 MG tablet Commonly known as: ALDACTONE  Take 1  tablet (25 mg total) by mouth daily.   Trelegy Ellipta 100-62.5-25 MCG/ACT Aepb Generic drug: Fluticasone-Umeclidin-Vilant Inhale 1 Inhalation into the lungs daily.   Voltaren 1 % Gel Generic drug: diclofenac Sodium Apply 1 a small amount to affected area four times a day        Discharge Exam: Filed Weights   08/12/24 1841 08/13/24 0500  Weight: (!) 143.4 kg (!) 143.5 kg     Condition at discharge: stable  The results of significant diagnostics from this hospitalization (including imaging, microbiology, ancillary and laboratory) are listed below for reference.   Imaging Studies: CARDIAC CATHETERIZATION Result Date: 08/12/2024   There is moderate to severe left ventricular systolic dysfunction.   LV end diastolic pressure is mildly elevated.   There is no aortic valve stenosis.   Anticipated discharge date to be determined.   No indication for antiplatelet therapy at this time . Conclusions: No angiographically significant coronary artery disease. Moderately to severely reduced left ventricular systolic function (evaluation limited by suboptimal opacification of the left ventricle). Mildly elevated left heart filling pressures (PCWP 19, LVEDP 20 mmHg). Moderately elevated right heart filling pressures (mean RA/RV EDP 12 mmHg). Mild pulmonary hypertension (PA 36/19, mean 25 mmHg). Normal Fick cardiac output/index (CO 6.3 L/min, CI  2.6 L/min/m). Recommendations: Maintain net even to slightly negative fluid balance. Continue goal-directed medical therapy for nonischemic cardiomyopathy. Further management of VF and concern for ICD RV lead dysfunction per electrophysiology. Lonni Hanson, MD [facility]  DG Chest 2 View Result Date: 08/12/2024 CLINICAL DATA:  Chest pain.  Shortness of breath. EXAM: CHEST - 2 VIEW COMPARISON:  May 30, 2017. FINDINGS: Stable cardiomegaly. Left-sided defibrillator is unchanged. Both lungs are clear. The visualized skeletal structures are  unremarkable. IMPRESSION: No active cardiopulmonary disease. Electronically Signed   By: Lynwood Landy Raddle M.D.   On: 08/12/2024 13:20   CUP PACEART INCLINIC DEVICE CHECK Result Date: 08/12/2024 Abnormal in-clinic _single__ chamber ICD check. Presenting Rhythm: _VP__ . Routine testing was performed. Threshold stable. RV integrity warning for elevated impedence, high rate NS episodes. VF episode 12/30, appears appropriate. HV therapy x 1 with clean break to VS/VP. See OV Note for further discussion. Estimated longevity _19 months___ . Pt is not enrolled in remote follow-up. CANDIE Needle, NP   Microbiology: Results for orders placed or performed during the hospital encounter of 08/12/24  MRSA Next Gen by PCR, Nasal     Status: None   Collection Time: 08/12/24  6:24 PM   Specimen: Nasal Mucosa; Nasal Swab  Result Value Ref Range Status   MRSA by PCR Next Gen NOT DETECTED NOT DETECTED Final    Comment: (NOTE) The GeneXpert MRSA Assay (FDA approved for NASAL specimens only), is one component of a comprehensive MRSA colonization surveillance program. It is not intended to diagnose MRSA infection nor to guide or monitor treatment for MRSA infections. Test performance is not FDA approved in patients less than 77 years old. Performed at [facility], [address] [place], [place] [address]     Labs: CBC: Recent Labs  Lab 08/12/24 1116 08/12/24 1649 08/12/24 1654 08/13/24 0252  WBC 7.1  --   --  7.7  HGB 13.8 13.6 13.9 13.2  HCT 41.1 40.0 41.0 39.6  MCV 90.5  --   --  90.8  PLT 224  --   --  215   Basic Metabolic Panel: Recent Labs  Lab 08/12/24 1116 08/12/24 1649 08/12/24 1654 08/13/24 0252  NA 138 140 140 139  K 4.1 3.6 3.7 3.5  CL 103  --   --  104  CO2 22  --   --  22  GLUCOSE 111*  --   --  99  BUN 21  --   --  20  CREATININE 0.99  --   --  0.83  CALCIUM 10.1  --   --  9.5  MG 2.1  --   --   --    Liver Function Tests: No results for input(s): AST, ALT,  ALKPHOS, BILITOT, PROT, ALBUMIN in the last 168 hours. CBG: Recent Labs  Lab 08/12/24 1825 08/13/24 0747  GLUCAP 86 111*    Discharge time spent: less than 30 minutes.  Signed: Ivan Vangie Pike, MD [facility] 08/13/2024 "

## 2024-08-13 NOTE — Progress Notes (Signed)
 "  Rounding Note   Patient Name: Penny Munoz Date of Encounter: 08/13/2024  [facility] Cardiologist: Deatrice Cage, MD   Subjective Patient reports feeling well without chest pain and shortness of breath.  Overall telemetry seems to be improved with lower ectopy burden.  Cardiac catheterization yesterday was without evidence of angiographically significant CAD.  Right and left heart filling pressures were moderately and mildly elevated with mild pulmonary hypertension.  Will give 1 dose of IV Lasix  this morning.  She is pending transfer to [facility] for further management by EP.  Scheduled Meds:  allopurinol  300 mg Oral Daily   budesonide-glycopyrrolate-formoterol  2 puff Inhalation BID   Chlorhexidine Gluconate Cloth  6 each Topical Daily   citalopram  20 mg Oral Daily   furosemide   40 mg Oral Daily   heparin   5,000 Units Subcutaneous Q8H   pantoprazole  40 mg Oral Daily   sacubitril -valsartan   1 tablet Oral BID   sodium chloride  flush  3 mL Intravenous Q12H   sodium chloride  flush  3 mL Intravenous Q12H   spironolactone   25 mg Oral Daily   umeclidinium-vilanterol  1 puff Inhalation Daily   Continuous Infusions:  sodium chloride      PRN Meds: sodium chloride , acetaminophen  **OR** acetaminophen , albuterol, ondansetron **OR** ondansetron (ZOFRAN) IV, senna-docusate, sodium chloride  flush   Vital Signs  Vitals:   08/13/24 0500 08/13/24 0600 08/13/24 0700 08/13/24 0800  BP: 103/72 96/78 118/79 107/76  Pulse: 66 69 70 72  Resp: 18 13 17 16   Temp:    98.6 F (37 C)  TempSrc:    Oral  SpO2: 93% 91% 94% 92%  Weight: (!) 143.5 kg     Height:        Intake/Output Summary (Last 24 hours) at 08/13/2024 1052 Last data filed at 08/12/2024 2236 Gross per 24 hour  Intake 240 ml  Output --  Net 240 ml      08/13/2024    5:00 AM 08/12/2024    6:41 PM 08/12/2024    9:26 AM  Last 3 Weights  Weight (lbs) 316 lb 5.8 oz 316 lb 2.2 oz 314 lb 6.4 oz  Weight  (kg) 143.5 kg 143.4 kg 142.611 kg      Telemetry Ventricular paced rhythm with intermittent PVCs and underlying complete heart block- Personally Reviewed  Physical Exam  GEN: No acute distress.   Neck: Difficult to assess JVD due to body habitus Cardiac: RRR, no murmurs, rubs, or gallops.  Respiratory: Clear to auscultation bilaterally. GI: Soft, nontender, non-distended  MS: Trace lower extremity edema; No deformity. Neuro:  Nonfocal  Psych: Normal affect   Labs High Sensitivity Troponin:  No results for input(s): TROPONINIHS in the last 720 hours.  Recent Labs  Lab 08/12/24 1116  TRNPT 17       Chemistry Recent Labs  Lab 08/12/24 1116 08/12/24 1649 08/12/24 1654 08/13/24 0252  NA 138 140 140 139  K 4.1 3.6 3.7 3.5  CL 103  --   --  104  CO2 22  --   --  22  GLUCOSE 111*  --   --  99  BUN 21  --   --  20  CREATININE 0.99  --   --  0.83  CALCIUM 10.1  --   --  9.5  MG 2.1  --   --   --   GFRNONAA 60*  --   --  >60  ANIONGAP 13  --   --  13    Lipids  Recent Labs  Lab 08/12/24 1116  CHOL 178  TRIG 180*  HDL 38*  LDLCALC 104*  CHOLHDL 4.7    Hematology Recent Labs  Lab 08/12/24 1116 08/12/24 1649 08/12/24 1654 08/13/24 0252  WBC 7.1  --   --  7.7  RBC 4.54  --   --  4.36  HGB 13.8 13.6 13.9 13.2  HCT 41.1 40.0 41.0 39.6  MCV 90.5  --   --  90.8  MCH 30.4  --   --  30.3  MCHC 33.6  --   --  33.3  RDW 14.0  --   --  14.3  PLT 224  --   --  215   Thyroid No results for input(s): TSH, FREET4 in the last 168 hours.  BNP Recent Labs  Lab 08/12/24 1116  PROBNP 1,068.0*    DDimer No results for input(s): DDIMER in the last 168 hours.   Radiology  DG Chest 2 View Result Date: 08/12/2024 IMPRESSION: No active cardiopulmonary disease. Electronically Signed   By: Lynwood Landy Raddle M.D.   On: 08/12/2024 13:20   CUP PACEART INCLINIC DEVICE CHECK Result Date: 08/12/2024 Abnormal in-clinic _single__ chamber ICD check. Presenting Rhythm: _VP__  . Routine testing was performed. Threshold stable. RV integrity warning for elevated impedence, high rate NS episodes. VF episode 12/30, appears appropriate. HV therapy x 1 with clean break to VS/VP. See OV Note for further discussion. Estimated longevity _19 months___ . Pt is not enrolled in remote follow-up. CANDIE Needle, NP   Cardiac Studies  08/12/2024 R/LHC No angiographically significant coronary artery disease. Moderately to severely reduced left ventricular systolic function (evaluation limited by suboptimal opacification of the left ventricle). Mildly elevated left heart filling pressures (PCWP 19, LVEDP 20 mmHg). Moderately elevated right heart filling pressures (mean RA/RV EDP 12 mmHg). Mild pulmonary hypertension (PA 36/19, mean 25 mmHg). Normal Fick cardiac output/index (CO 6.3 L/min, CI 2.6 L/min/m).  Patient Profile   73 y.o. female with a hx of HFrEF, nonischemic cardiomyopathy, s/p ICD (2004), hypertension, retroperitoneal mass, prior tobacco use, OSA on CPAP, COPD, and nonobstructive CAD who is admitted 12/30 after an episode of VF with HV therapy also with acute on chronic HFrEF, complete heart block, and concern for RV lead integrity on ICD.  Assessment & Plan   Acute on chronic HFrEF - Most recent echo 04/2023 with EF 35-40% and global hypokinesis - ProBNP mildly elevated on admission in the setting of noncompliance with outpatient diuretic regimen for several weeks - RHC yesterday revealed moderately to severely reduced LVSF with mildly elevated left heart filling pressures, moderately elevated right heart filling pressures, and mild pulmonary hypertension - Will give IV Lasix  40 mg x 1 today with plan to transition back to oral Lasix  40 mg daily tomorrow - Continue to monitor kidney function, strict I/Os, and daily weights with ongoing diuresis - Continue Entresto  and spironolactone  as blood pressure allows - Repeat echo pending  Ventricular fibrillation Complete heart  block Nonischemic cardiomyopathy s/p ICD - Presenting after referral from EP clinic for VF with ICD shock and concern for RV lead integrity - Patient reports chest pain around 2 AM this morning when she was pushing her bed when she suddenly fell and loss control of her bladder - Device interrogation revealed an episode of VF with HV therapy converting to VS rhythm - EKG and telemetry show ventricular paced rhythm with intermittent PVCs and underlying complete heart block - Cardiac catheterization  yesterday without angiographically significant CAD - EP following and patient is pending transfer to [facility] for further management   For questions or updates, please contact [facility] Please consult [email or web address] for contact info under       Signed, Lesley LITTIE Maffucci, PA-C  08/13/2024, 10:52 AM    "

## 2024-08-13 NOTE — Consult Note (Addendum)
 "   ELECTROPHYSIOLOGY CONSULT NOTE    Patient ID: Penny Munoz MRN: [id number], DOB/AGE: [birth year] 73 y.o.  Admit date: 08/12/2024 Date of Consult: 08/13/2024  Primary Physician: Buren Rock HERO, MD Primary Cardiologist: Deatrice Cage, MD  Electrophysiologist: Dr. Cindie   Referring Provider: @ATTENDING @  Patient Profile: Penny Munoz is a 73 y.o. female with a history of HFrEF, NICM s/p ICD, non-obs CAD, HTN, OSA on CPAP, COPD  who is being seen today for the evaluation of CHB, VF at the request of Dr. Dana.  HPI:  Penny Munoz is a 73 y.o. female with PMH as above who was seen for urgent clinic visit 12/30 due to ICD alarming.  Interrogation revealed RV lead integrity alarm for high rate NS episodes and sensing integrity counter which is concerning for RV lead failure.  Device interrogation further revealed patient in CHB with escape in mid-30s, requiring high RV pacing. She also had episode of VF in the early morning hours of 12/30 with ICD shock x 1.   Given all of these concerns, she was transported to ER for admission and further workup. She had R/LHC yesterday without angiographically significant CAD, normal CO/CI. Mod elevated LV and RV filling pressures.  She is pending transfer to [facility].  She remains anxious about next steps regarding her device and when she will be discharged. 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   She denies chest pain, chest pressure, no palpitations. Normal appetite, urinating well   Labs Potassium3.5 (12/31 0252) Magnesium  2.1 (12/30 1116) Creatinine, ser  0.83 (12/31 0252) PLT  215 (12/31 0252) HGB  13.2 (12/31 0252) WBC 7.7 (12/31 0252)  .    Past Medical History:  Diagnosis Date   CHF (congestive heart failure) (HCC)    COPD (chronic obstructive pulmonary disease) (HCC)    Coronary artery disease    Hypertension      Surgical History:  Past Surgical History:  Procedure Laterality Date   CARDIAC  DEFIBRILLATOR PLACEMENT     RIGHT/LEFT HEART CATH AND CORONARY ANGIOGRAPHY N/A 08/12/2024   Procedure: RIGHT/LEFT HEART CATH AND CORONARY ANGIOGRAPHY;  Surgeon: Mady Bruckner, MD;  Location: [facility] INVASIVE CV LAB;  Service: Cardiovascular;  Laterality: N/A;     Medications Prior to Admission  Medication Sig Dispense Refill Last Dose/Taking   acetaminophen  (TYLENOL ) 325 MG tablet Take by mouth as needed.    Past Month   albuterol (VENTOLIN HFA) 108 (90 Base) MCG/ACT inhaler Inhale into the lungs as needed.   Past Month   allopurinol (ZYLOPRIM) 300 MG tablet Take 300 mg by mouth daily.   08/12/2024   [Paused] carvedilol  (COREG ) 12.5 MG tablet Take 1 tablet (12.5 mg total) by mouth 2 (two) times daily with a meal. 180 tablet 3 08/12/2024   cetirizine (ZYRTEC) 10 MG tablet Take 10 mg by mouth daily.   Unknown   citalopram (CELEXA) 20 MG tablet Take 20 mg by mouth daily.   08/11/2024   Fluticasone-Umeclidin-Vilant (TRELEGY ELLIPTA) 100-62.5-25 MCG/ACT AEPB Inhale 1 Inhalation into the lungs daily.   08/12/2024 Morning   furosemide  (LASIX ) 40 MG tablet Take 1 tablet (40 mg total) by mouth daily. 90 tablet 3 08/12/2024   GARLIC PO Take by mouth daily.   08/11/2024   Multiple Vitamins-Minerals (MULTIVITAMIN WITH MINERALS) tablet Take 1 tablet by mouth daily.   08/12/2024   omeprazole (PRILOSEC) 20 MG capsule Take 20 mg by mouth daily.   08/12/2024   spironolactone  (ALDACTONE ) 25 MG tablet Take  1 tablet (25 mg total) by mouth daily. 90 tablet 3 08/11/2024   Tiotropium Bromide Monohydrate  (SPIRIVA  RESPIMAT) 1.25 MCG/ACT AERS Inhale 2 puffs into the lungs daily. 4 g 0 08/11/2024   fluticasone (FLONASE) 50 MCG/ACT nasal spray Place 1 spray into both nostrils daily. (Patient not taking: No sig reported)      HYDROcodone -acetaminophen  (NORCO/VICODIN) 5-325 MG tablet Take 1 tablet by mouth every 6 (six) hours as needed. (Patient not taking: Reported on 08/12/2024) 15 tablet 0 Not Taking   Multiple Minerals  (CALCIUM/MAGNESIUM/ZINC PO) Take by mouth. Takes 1 tablet every 3 days      sacubitril -valsartan  (ENTRESTO ) 24-26 MG TAKE 1 TABLET TWICE A DAY 180 tablet 2    VOLTAREN 1 % GEL Apply 1 a small amount to affected area four times a day (Patient not taking: No sig reported)   Not Taking    Inpatient Medications:   allopurinol  300 mg Oral Daily   budesonide-glycopyrrolate-formoterol  2 puff Inhalation BID   Chlorhexidine Gluconate Cloth  6 each Topical Daily   citalopram  20 mg Oral Daily   furosemide   40 mg Oral Daily   heparin   5,000 Units Subcutaneous Q8H   pantoprazole  40 mg Oral Daily   sacubitril -valsartan   1 tablet Oral BID   sodium chloride  flush  3 mL Intravenous Q12H   sodium chloride  flush  3 mL Intravenous Q12H   spironolactone   25 mg Oral Daily   umeclidinium-vilanterol  1 puff Inhalation Daily    Allergies: Allergies[1]  Family History  Adopted: Yes     Physical Exam: Vitals:   08/13/24 0500 08/13/24 0600 08/13/24 0700 08/13/24 0800  BP: 103/72 96/78 118/79 107/76  Pulse: 66 69 70 72  Resp: 18 13 17 16   Temp:    98.6 F (37 C)  TempSrc:    Oral  SpO2: 93% 91% 94% 92%  Weight: (!) 143.5 kg     Height:        GEN- NAD, A&O x 3, normal affect HEENT: Normocephalic, atraumatic Lungs- diminished throughout, Normal effort.  Heart- regularly irregular rate and rhythm, No M/G/R.  GI- Soft, NT, ND.  Extremities- No clubbing, cyanosis, or edema   Radiology/Studies: CARDIAC CATHETERIZATION Result Date: 08/12/2024   There is moderate to severe left ventricular systolic dysfunction.   LV end diastolic pressure is mildly elevated.   There is no aortic valve stenosis.   Anticipated discharge date to be determined.   No indication for antiplatelet therapy at this time . Conclusions: No angiographically significant coronary artery disease. Moderately to severely reduced left ventricular systolic function (evaluation limited by suboptimal opacification of the left ventricle).  Mildly elevated left heart filling pressures (PCWP 19, LVEDP 20 mmHg). Moderately elevated right heart filling pressures (mean RA/RV EDP 12 mmHg). Mild pulmonary hypertension (PA 36/19, mean 25 mmHg). Normal Fick cardiac output/index (CO 6.3 L/min, CI 2.6 L/min/m). Recommendations: Maintain net even to slightly negative fluid balance. Continue goal-directed medical therapy for nonischemic cardiomyopathy. Further management of VF and concern for ICD RV lead dysfunction per electrophysiology. Penny Hanson, MD [facility]  DG Chest 2 View Result Date: 08/12/2024 CLINICAL DATA:  Chest pain.  Shortness of breath. EXAM: CHEST - 2 VIEW COMPARISON:  May 30, 2017. FINDINGS: Stable cardiomegaly. Left-sided defibrillator is unchanged. Both lungs are clear. The visualized skeletal structures are unremarkable. IMPRESSION: No active cardiopulmonary disease. Electronically Signed   By: Lynwood Landy Raddle M.D.   On: 08/12/2024 13:20   CUP PACEART INCLINIC DEVICE  CHECK Result Date: 08/12/2024 Abnormal in-clinic _single__ chamber ICD check. Presenting Rhythm: _VP__ . Routine testing was performed. Threshold stable. RV integrity warning for elevated impedence, high rate NS episodes. VF episode 12/30, appears appropriate. HV therapy x 1 with clean break to VS/VP. See OV Note for further discussion. Estimated longevity _19 months___ . Pt is not enrolled in remote follow-up. Penny Needle, NP   EKG: 08/13/2024 at 854 - VP with bigeminal PVC  (personally reviewed)  TELEMETRY: VP with frequent PVC vs intrinsic  (personally reviewed)  DEVICE HISTORY:  MDT single chamber ICD, implanted 2015 Battery 19 months Programmed VVI 70 currently Episode #63 - VF appropriately sensed by device with HV therapy x 1 with successful conversion to VS/VP   Assessment/Plan: #) VF #) ICD shock #) NICM  #) HFrEF #) CHB  Presented to [facility] ER from EP clinic appt.  RV lead with concern for lead malfunction. Unclear whether NSVT  episodes are RV noise vs true NSVT episodes.   Interrogation of device revealed VF episode in early morning hours 12/30 where device appropriate sensed event and delivered successful HV therapy x 1 with conversion to VS / VP.   LHC yesterday without significant CAD. No further VT/VF on telemetry. Continues to have frequent PVCs Will start IV amiodarone  Pending transfer to [facility] for device upgrade vs extraction/reimplantation. Discussion ongoing related to possible RV lead malfunction  Keep K > 4, Mag > 2 Remain on tele     For questions or updates, please contact Penny Munoz Please consult [email or web address] for contact info under    Signed, Penny Riddle, NP  08/13/2024 12:57 PM  I have seen the patient via virtual consultation today.  The patient expressed their consent to participate in today's virtual consult. The patient was located at [facility] while I was located at [facility].  I have seen, examined the patient, and reviewed the above assessment and plan.    HPI: Penny Munoz is a 73 y.o. female with a history of HFrEF, NICM s/p ICD, non-obs CAD, HTN, OSA on CPAP, COPD who presented to clinic on 12/30 for an acute visit due to ICD alarming.  Patient is not enrolled in remote monitoring.  Device was interrogated and showed appropriate shock for ventricular fibrillation, as well as lead integrity alerts.  Patient had additionally reported feeling unwell of late.  Importantly, she had significant increase in her RV pacing approximately 2 weeks ago.  Pacing was at a backup rate of VVI 40 bpm.  Patient had noticed increased fluid retention and shortness of breath.  She was sent to the ED for further evaluation.  She underwent left and right heart cath yesterday which showed no angiographically significant coronary disease, mildly elevated intracardiac pressures with preserved outputs.  General: Well developed, in no acute distress.  Resp:  Normal work of breathing.  Neuro: No gross focal deficits.  Psych: Normal affect.   Echo 08/13/24:   1. Left ventricular ejection fraction, by estimation, is 35 to 40%. The  left ventricle has moderately decreased function. The left ventricle  demonstrates global hypokinesis. The left ventricular internal cavity size  was moderately dilated. Left  ventricular diastolic parameters are indeterminate.   2. Right ventricular systolic function is normal. The right ventricular  size is normal. There is normal pulmonary artery systolic pressure. The  estimated right ventricular systolic pressure is 18.8 mmHg.   3. The mitral valve is normal in structure. No evidence of mitral valve  regurgitation. No evidence of mitral stenosis.   4. The aortic valve is normal in structure. Aortic valve regurgitation is  not visualized. No aortic stenosis is present.   5. The inferior vena cava is normal in size with greater than 50%  respiratory variability, suggesting right atrial pressure of 3 mmHg.   LHC/RHC 08/12/24:    There is moderate to severe left ventricular systolic dysfunction.   LV end diastolic pressure is mildly elevated.   There is no aortic valve stenosis.   Anticipated discharge date to be determined.   No indication for antiplatelet therapy at this time .   Conclusions: No angiographically significant coronary artery disease. Moderately to severely reduced left ventricular systolic function (evaluation limited by suboptimal opacification of the left ventricle). Mildly elevated left heart filling pressures (PCWP 19, LVEDP 20 mmHg). Moderately elevated right heart filling pressures (mean RA/RV EDP 12 mmHg). Mild pulmonary hypertension (PA 36/19, mean 25 mmHg). Normal Fick cardiac output/index (CO 6.3 L/min, CI 2.6 L/min/m).  Assessment: Penny Munoz Penny is a 73 year old female with past medical history notable for chronic systolic heart failure status post ICD who presented to clinic  yesterday following a device shock.  Interrogation of her device notes ventricular fibrillation.  She was also found to be in new complete heart block with increased ventricular pacing over the past couple of weeks.  On ECG she had ventricular pacing at a backup rate of VVI 40 bpm with frequent competitive PVCs and possibly intermittently conducted beats.  I suspect that she had a bradycardia mediated R on T event precipitating her ventricular fibrillation.  Patient had left heart cath which did not show significant coronary disease to account for her VF.  There was a [employer] lead integrity alert given the short VV intervals, but there was no evidence of obvious lead noise.  All of the lead parameters are stable and appropriate.  Problem List:  #ICD shock #VF #Complete heart block #Chronic systolic heart failure  Plan:  - Patient needs upgrade to CRT-D given her complete heart block and low LVEF.  Will need addition of RA and CS leads.  Tentatively to be done on Friday in [place].  Will need to transfer when bed becomes available.  Patient agreeable. - Start amiodarone for PVC suppression and increase pacing rate to 70 bpm.  Plan would be for amiodarone to be short-term while we await device upgrade.  Total time of encounter: 70 minutes total time of encounter, including face-to-face patient care, coordination of care and counseling regarding high complexity medical decision making re: ventricular fibrillation, chronic systolic heart failure, heart block, ICD shock..  H9572 - ER or Initial Inpatient 70 min -GT modifier - via interactive audio and video telecommunication systems  Fonda Kitty, MD, [facility], [facility] Cardiac Electrophysiology         [1]  Allergies Allergen Reactions   Hydrocodone  Other (See Comments)    took one pill can't tolerate took one pill can't tolerate    Oxycodone Hcl Shortness Of Breath   Ampicillin     Other reaction(s): Angioedema Other reaction(s):  Angioedema    Amoxicillin Rash   Penicillins Anxiety   "

## 2024-08-13 NOTE — Care Management CC44 (Signed)
"         Condition Code 44 Documentation Completed  Patient Details  Name: Penny Munoz MRN: [id number] Date of Birth: 14-Jul-1951   Condition Code 44 given:  Yes Patient signature on Condition Code 44 notice:   (Patient refused to sign) Documentation of 2 MD's agreement:  Yes Code 44 added to claim:  Yes    [name], LCSW 08/13/2024, 9:23 AM  "

## 2024-08-13 NOTE — Care Management Obs Status (Signed)
 MEDICARE OBSERVATION STATUS NOTIFICATION   Patient Details  Name: Penny Munoz MRN: [id number] Date of Birth: 10-28-1950   Medicare Observation Status Notification Given:  Yes    [name], LCSW 08/13/2024, 9:22 AM

## 2024-08-13 NOTE — Progress Notes (Signed)
*  PRELIMINARY RESULTS* Echocardiogram 2D Echocardiogram has been performed.  Floydene Harder 08/13/2024, 8:02 AM

## 2024-08-13 NOTE — H&P (Addendum)
 " History and Physical    Penny Munoz FMW:[id number] DOB: [birth year] DOA: 08/13/2024  PCP: [place]ren [place] HERO, MD   Patient coming from: [place]  I have personally briefly reviewed patient's old medical records in [facility]  Chief Complaint: Biventricular defibrillator dysfunction.  HPI: Penny Munoz is a 73 y.o. female with medical history significant for systolic CHF, COPD, ventricular fibrillation, OSA on CPAP, bipolar disorder, asthma. Patient was admitted to [facility] yesterday 12/30 to 12/31.  Patient went to her EP clinic for acute visit -ICD device alert.  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  She underwent right and left cardiac cath-12/30, without angiographically significant CAD.  Started on amiodarone drip , and transferred to [facility] for device upgrade versus extraction/reimplantation.  In the time of my evaluation, patient has no complaints.  No chest pain no difficulty breathing.  She reports she is feels better since starting amiodarone drip and getting 1 dose of IV Lasix  today.  Prior to admission patient said she stopped taking Lasix  thinking spironolactone  and Lasix  were both diuretics and she was taking too many medications.  Since then she had progressive difficulty breathing.  Review of Systems: As per HPI all other systems reviewed and negative.  Past Medical History:  Diagnosis Date   CHF (congestive heart failure) (HCC)    COPD (chronic obstructive pulmonary disease) (HCC)    Coronary artery disease    Hypertension     Past Surgical History:  Procedure Laterality Date   CARDIAC DEFIBRILLATOR PLACEMENT     RIGHT/LEFT HEART CATH AND CORONARY ANGIOGRAPHY N/A 08/12/2024   Procedure: RIGHT/LEFT HEART CATH AND CORONARY  ANGIOGRAPHY;  Surgeon: Mady Bruckner, MD;  Location: [facility] INVASIVE CV LAB;  Service: Cardiovascular;  Laterality: N/A;     reports that she quit smoking about 11 years ago. Her smoking use included cigarettes. She has never used smokeless tobacco. She reports that she does not drink alcohol and does not use drugs.  Allergies[1]  Family History  Adopted: Yes    Prior to Admission medications  Medication Sig Start Date End Date Taking? Authorizing Provider  albuterol (VENTOLIN HFA) 108 (90 Base) MCG/ACT inhaler Inhale into the lungs as needed.    [provider]  allopurinol (ZYLOPRIM) 300 MG tablet Take 300 mg by mouth daily.    [provider]  [Paused] carvedilol  (COREG ) 12.5 MG tablet Take 1 tablet (12.5 mg total) by mouth 2 (two) times daily with a meal. Wait to take this until your doctor or other care provider tells you to start again. 02/20/24   Darron Deatrice LABOR, MD  cetirizine (ZYRTEC) 10 MG tablet Take 10 mg by mouth daily. 04/05/21   [provider]  citalopram (CELEXA) 20 MG tablet Take 20 mg by mouth daily. 02/07/21   [provider]  fluticasone (FLONASE) 50 MCG/ACT nasal spray Place 1 spray into both nostrils daily. Patient not taking: No sig reported 11/29/20   [provider]  Fluticasone-Umeclidin-Vilant (TRELEGY ELLIPTA) 100-62.5-25 MCG/ACT AEPB Inhale 1 Inhalation into the lungs daily.    [provider]  furosemide  (LASIX ) 40 MG tablet Take 1 tablet (40 mg total) by mouth daily. 03/03/22   Darron Deatrice LABOR, MD  GARLIC PO Take by mouth daily.  [provider]  hydrALAZINE (APRESOLINE) 20 MG/ML injection Inject 0.5 mLs (10 mg total) into the vein every 20 (twenty) minutes as needed (high blood pressure). 08/12/24   Fernand Prost, MD  Multiple Minerals (CALCIUM/MAGNESIUM/ZINC PO) Take by mouth. Takes 1 tablet every 3 days    [provider]  Multiple Vitamins-Minerals (MULTIVITAMIN WITH MINERALS) tablet  Take 1 tablet by mouth daily.    [provider]  omeprazole (PRILOSEC) 20 MG capsule Take 20 mg by mouth daily.    [provider]  sacubitril -valsartan  (ENTRESTO ) 24-26 MG TAKE 1 TABLET TWICE A DAY 05/29/24   Darron Deatrice LABOR, MD  spironolactone  (ALDACTONE ) 25 MG tablet Take 1 tablet (25 mg total) by mouth daily. 02/20/24   Darron Deatrice LABOR, MD  VOLTAREN 1 % GEL Apply 1 a small amount to affected area four times a day Patient not taking: No sig reported 04/19/21   [provider]    Physical Exam: Vitals:   08/13/24 2101  BP: 137/87  Pulse: (!) 44  Resp: 18  Temp: 98 F (36.7 C)  TempSrc: Oral  SpO2: 95%  Weight: (!) 142 kg  Height: 5' 8 (1.727 m)    Constitutional: NAD, calm, comfortable Vitals:   08/13/24 2101  BP: 137/87  Pulse: (!) 44  Resp: 18  Temp: 98 F (36.7 C)  TempSrc: Oral  SpO2: 95%  Weight: (!) 142 kg  Height: 5' 8 (1.727 m)   Eyes: PERRL, lids and conjunctivae normal ENMT: Mucous membranes are moist.  Neck: normal, supple, no masses, no thyromegaly Respiratory: clear to auscultation bilaterally, no wheezing, no crackles. Normal respiratory effort. No accessory muscle use.  Cardiovascular: Regular rate and rhythm, no murmurs / rubs / gallops. No extremity edema.  Extremities warm. Abdomen: no tenderness, no masses palpated. No hepatosplenomegaly. Bowel sounds positive.  Musculoskeletal: no clubbing / cyanosis. No joint deformity upper and lower extremities.  Skin: no rashes, lesions, ulcers. No induration Neurologic: No facial asymmetry, moves extremities spontaneously, speech fluent Psychiatric: Normal judgment and insight. Alert and oriented x 3. Normal mood.   Labs on Admission: I have personally reviewed following labs and imaging studies  CBC: Recent Labs  Lab 08/12/24 1116 08/12/24 1649 08/12/24 1654 08/13/24 0252  WBC 7.1  --   --  7.7  HGB 13.8 13.6 13.9 13.2  HCT 41.1 40.0 41.0 39.6  MCV 90.5  --   --  90.8   PLT 224  --   --  215   Basic Metabolic Panel: Recent Labs  Lab 08/12/24 1116 08/12/24 1649 08/12/24 1654 08/13/24 0252  NA 138 140 140 139  K 4.1 3.6 3.7 3.5  CL 103  --   --  104  CO2 22  --   --  22  GLUCOSE 111*  --   --  99  BUN 21  --   --  20  CREATININE 0.99  --   --  0.83  CALCIUM 10.1  --   --  9.5  MG 2.1  --   --   --    GFR: Estimated Creatinine Clearance: 92 mL/min (by C-G formula based on SCr of 0.83 mg/dL). Liver Function Tests: Recent Labs  Lab 08/13/24 0252  AST 17  ALT 16  ALKPHOS 104  BILITOT 0.4  PROT 6.9  ALBUMIN 3.9   Coagulation Profile: Recent Labs  Lab 08/12/24 1116  INR 1.1   BNP (last 3 results) Recent Labs    08/12/24 1116  PROBNP  1,068.0*   HbA1C: No results for input(s): HGBA1C in the last 72 hours. CBG: Recent Labs  Lab 08/12/24 1825 08/13/24 0747  GLUCAP 86 111*   Lipid Profile: Recent Labs    08/12/24 1116  CHOL 178  HDL 38*  LDLCALC 104*  TRIG 180*  CHOLHDL 4.7   Thyroid Function Tests: Recent Labs    08/13/24 0252  TSH 1.290  FREET4 1.09   Radiological Exams on Admission: ECHOCARDIOGRAM COMPLETE Result Date: 08/13/2024    ECHOCARDIOGRAM REPORT   Patient Name:   Savilla WYCHE KEITT Date of Exam: 08/13/2024 Medical Rec #:  [id number]             Height:       68.0 in Accession #:    [id number]            Weight:       316.4 lb Date of Birth:  Sep 15, 1950             BSA:          2.484 m Patient Age:    73 years              BP:           118/79 mmHg Patient Gender: F                     HR:           70 bpm. Exam Location:  [facility] Procedure: 2D Echo, Cardiac Doppler and Color Doppler (Both Spectral and Color            Flow Doppler were utilized during procedure). Indications:     Congestive heart failure I50.9                  Malfunction of implantable defibrillator ventricular ICD lead.  History:         Patient has prior history of Echocardiogram examinations, most                  recent 04/25/2023. CHF,  COPD; Risk Factors:Hypertension.  Sonographer:     Christopher Furnace Referring Phys:  [phone] CHRISTOPHER END Diagnosing Phys: Evalene Lunger MD  Sonographer Comments: Technically challenging study due to limited acoustic windows, no subcostal window and no apical window. IMPRESSIONS  1. Left ventricular ejection fraction, by estimation, is 35 to 40%. The left ventricle has moderately decreased function. The left ventricle demonstrates global hypokinesis. The left ventricular internal cavity size was moderately dilated. Left ventricular diastolic parameters are indeterminate.  2. Right ventricular systolic function is normal. The right ventricular size is normal. There is normal pulmonary artery systolic pressure. The estimated right ventricular systolic pressure is 18.8 mmHg.  3. The mitral valve is normal in structure. No evidence of mitral valve regurgitation. No evidence of mitral stenosis.  4. The aortic valve is normal in structure. Aortic valve regurgitation is not visualized. No aortic stenosis is present.  5. The inferior vena cava is normal in size with greater than 50% respiratory variability, suggesting right atrial pressure of 3 mmHg. FINDINGS  Left Ventricle: Left ventricular ejection fraction, by estimation, is 35 to 40%. The left ventricle has moderately decreased function. The left ventricle demonstrates global hypokinesis. Strain was performed and the global longitudinal strain is indeterminate. The left ventricular internal cavity size was moderately dilated. There is no left ventricular hypertrophy. Left ventricular diastolic parameters are indeterminate. Right Ventricle: The right ventricular size is normal. No increase in right ventricular  wall thickness. Right ventricular systolic function is normal. There is normal pulmonary artery systolic pressure. The tricuspid regurgitant velocity is 1.86 m/s, and  with an assumed right atrial pressure of 5 mmHg, the estimated right ventricular systolic pressure is  18.8 mmHg. Left Atrium: Left atrial size was normal in size. Right Atrium: Right atrial size was normal in size. Pericardium: There is no evidence of pericardial effusion. Mitral Valve: The mitral valve is normal in structure. No evidence of mitral valve regurgitation. No evidence of mitral valve stenosis. Tricuspid Valve: The tricuspid valve is normal in structure. Tricuspid valve regurgitation is not demonstrated. No evidence of tricuspid stenosis. Aortic Valve: The aortic valve is normal in structure. Aortic valve regurgitation is not visualized. No aortic stenosis is present. Pulmonic Valve: The pulmonic valve was normal in structure. Pulmonic valve regurgitation is not visualized. No evidence of pulmonic stenosis. Aorta: The aortic root is normal in size and structure. Venous: The inferior vena cava is normal in size with greater than 50% respiratory variability, suggesting right atrial pressure of 3 mmHg. IAS/Shunts: No atrial level shunt detected by color flow Doppler. Additional Comments: 3D was performed not requiring image post processing on an independent workstation and was indeterminate. A device lead is visualized.  LEFT VENTRICLE PLAX 2D LVIDd:         4.50 cm LVIDs:         3.50 cm LV PW:         1.60 cm LV IVS:        1.50 cm LVOT diam:     2.00 cm LVOT Area:     3.14 cm  LEFT ATRIUM         Index LA diam:    3.80 cm 1.53 cm/m   AORTA Ao Root diam: 2.70 cm TRICUSPID VALVE TR Peak grad:   13.8 mmHg TR Vmax:        186.00 cm/s  SHUNTS Systemic Diam: 2.00 cm Evalene Lunger MD Electronically signed by Evalene Lunger MD Signature Date/Time: 08/13/2024/1:47:07 PM    Final    CARDIAC CATHETERIZATION Result Date: 08/12/2024   There is moderate to severe left ventricular systolic dysfunction.   LV end diastolic pressure is mildly elevated.   There is no aortic valve stenosis.   Anticipated discharge date to be determined.   No indication for antiplatelet therapy at this time . Conclusions: No  angiographically significant coronary artery disease. Moderately to severely reduced left ventricular systolic function (evaluation limited by suboptimal opacification of the left ventricle). Mildly elevated left heart filling pressures (PCWP 19, LVEDP 20 mmHg). Moderately elevated right heart filling pressures (mean RA/RV EDP 12 mmHg). Mild pulmonary hypertension (PA 36/19, mean 25 mmHg). Normal Fick cardiac output/index (CO 6.3 L/min, CI 2.6 L/min/m). Recommendations: Maintain net even to slightly negative fluid balance. Continue goal-directed medical therapy for nonischemic cardiomyopathy. Further management of VF and concern for ICD RV lead dysfunction per electrophysiology. Lonni Hanson, MD [facility]  DG Chest 2 View Result Date: 08/12/2024 CLINICAL DATA:  Chest pain.  Shortness of breath. EXAM: CHEST - 2 VIEW COMPARISON:  May 30, 2017. FINDINGS: Stable cardiomegaly. Left-sided defibrillator is unchanged. Both lungs are clear. The visualized skeletal structures are unremarkable. IMPRESSION: No active cardiopulmonary disease. Electronically Signed   By: Lynwood Landy Raddle M.D.   On: 08/12/2024 13:20   CUP PACEART INCLINIC DEVICE CHECK Result Date: 08/12/2024 Abnormal in-clinic _single__ chamber ICD check. Presenting Rhythm: _VP__ . Routine testing was performed. Threshold stable. RV integrity warning for  elevated impedence, high rate NS episodes. VF episode 12/30, appears appropriate. HV therapy x 1 with clean break to VS/VP. See OV Note for further discussion. Estimated longevity _19 months___ . Pt is not enrolled in remote follow-up. CANDIE Needle, NP   EKG: Independently reviewed.  Last EKG-artifacts present, but shows AV paced rhythm, rate 105, QTc 510.  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 She underwent right and left cardiac cath-12/30, without angiographically significant CAD.   - Started on amiodarone drip, continue - Transferred to [facility] for device upgrade versus extraction/reimplantation. - I reached out to cardiology fellow on-call via secure chat, for patient to be seen in the morning, by cardiology/EP-Dr. Orlando, patient will be seen.  Acute on chronic congestive heart failure-proBNP elevated 1068 no prior to compare.  12/30 - Chest x-ray without acute abnormality.  Echo today 12/31 - EF 35 to 40%.  Was not compliant with Lasix  40 mg daily for about 4 weeks.  Cardiology recs to maintain net even slightly negative fluid balance.  Continue goal-directed medical therapy for NICM. - Continue IV least 40 BID x 2 doses, further dosing pending clinical course - Potassium 3.5, replace K-Dur x 1  Ventricular fibrillation-ICD delivered shock x 1.  Magnesium 2.1.  K- 3.5 - Continue on amiodarone drip (carvedilol  held while on amiodarone drip)  COPD-stable.  -Resume home regimen - DuoNebs as needed  Bipolar disorder - Resume Celexa  OSA on CPAP   DVT prophylaxis: SCDs for now pending EP/cardiology eval Code Status: Full code Family Communication: Daughter at bedside Disposition Plan: ~ 2 days Consults called: Cardiology/EP Admission status: Inpatient, progressive I certify that at the point of admission it is my clinical judgment that the patient will require inpatient hospital care spanning beyond 2 midnights from the point of admission due to high intensity of service, high risk for further deterioration and high frequency of  surveillance required.   CRITICAL CARE Performed by: Tully FORBES Carwin   Total critical care time: 50 minutes  Critical care time was exclusive of separately billable procedures and treating other patients.  Critical care was necessary to treat or prevent imminent or life-threatening deterioration.  Critical care was time spent personally by me on the following activities: development of treatment plan with patient and/or surrogate as well as nursing, discussions with consultants, evaluation of patient's response to treatment, examination of patient, obtaining history from patient or surrogate, ordering and performing treatments and interventions, ordering and review of laboratory studies, ordering and review of radiographic studies, pulse oximetry and re-evaluation of patient's condition.    Author: Tully FORBES Carwin, MD 08/13/2024 10:53 PM  For on call review [email or web address].      [1]  Allergies Allergen Reactions   Hydrocodone  Other (See Comments)    took one pill can't tolerate took one pill can't tolerate    Oxycodone Hcl Shortness Of Breath   Ampicillin     Other reaction(s): Angioedema Other reaction(s): Angioedema    Amoxicillin Rash   Penicillins Anxiety   "

## 2024-08-14 DIAGNOSIS — I4901 Ventricular fibrillation: Secondary | ICD-10-CM | POA: Diagnosis not present

## 2024-08-14 DIAGNOSIS — I5023 Acute on chronic systolic (congestive) heart failure: Secondary | ICD-10-CM | POA: Diagnosis not present

## 2024-08-14 DIAGNOSIS — T82198A Other mechanical complication of other cardiac electronic device, initial encounter: Secondary | ICD-10-CM | POA: Diagnosis not present

## 2024-08-14 DIAGNOSIS — Z4502 Encounter for adjustment and management of automatic implantable cardiac defibrillator: Secondary | ICD-10-CM

## 2024-08-14 DIAGNOSIS — I442 Atrioventricular block, complete: Secondary | ICD-10-CM | POA: Diagnosis not present

## 2024-08-14 LAB — CBC
HCT: 43.2 % (ref 36.0–46.0)
Hemoglobin: 14.4 g/dL (ref 12.0–15.0)
MCH: 30.9 pg (ref 26.0–34.0)
MCHC: 33.3 g/dL (ref 30.0–36.0)
MCV: 92.7 fL (ref 80.0–100.0)
Platelets: 230 K/uL (ref 150–400)
RBC: 4.66 MIL/uL (ref 3.87–5.11)
RDW: 14.4 % (ref 11.5–15.5)
WBC: 9.3 K/uL (ref 4.0–10.5)
nRBC: 0 % (ref 0.0–0.2)

## 2024-08-14 LAB — BASIC METABOLIC PANEL WITH GFR
Anion gap: 13 (ref 5–15)
BUN: 23 mg/dL (ref 8–23)
CO2: 22 mmol/L (ref 22–32)
Calcium: 9.3 mg/dL (ref 8.9–10.3)
Chloride: 104 mmol/L (ref 98–111)
Creatinine, Ser: 1.06 mg/dL — ABNORMAL HIGH (ref 0.44–1.00)
GFR, Estimated: 56 mL/min — ABNORMAL LOW
Glucose, Bld: 115 mg/dL — ABNORMAL HIGH (ref 70–99)
Potassium: 4.4 mmol/L (ref 3.5–5.1)
Sodium: 139 mmol/L (ref 135–145)

## 2024-08-14 LAB — SURGICAL PCR SCREEN
MRSA, PCR: NEGATIVE
Staphylococcus aureus: NEGATIVE

## 2024-08-14 MED ORDER — CHLORHEXIDINE GLUCONATE 4 % EX SOLN
60.0000 mL | Freq: Once | CUTANEOUS | Status: AC
  Administered 2024-08-14: 4 via TOPICAL
  Filled 2024-08-14: qty 60

## 2024-08-14 MED ORDER — CEFAZOLIN SODIUM-DEXTROSE 3-4 GM/150ML-% IV SOLN
3.0000 g | INTRAVENOUS | Status: AC
  Administered 2024-08-15: 3 g via INTRAVENOUS
  Filled 2024-08-14 (×2): qty 150

## 2024-08-14 MED ORDER — NICOTINE POLACRILEX 2 MG MT GUM
2.0000 mg | CHEWING_GUM | OROMUCOSAL | Status: DC | PRN
  Administered 2024-08-17: 2 mg via ORAL
  Filled 2024-08-14 (×3): qty 1

## 2024-08-14 MED ORDER — SODIUM CHLORIDE 0.9 % IV SOLN
80.0000 mg | INTRAVENOUS | Status: AC
  Administered 2024-08-15: 80 mg
  Filled 2024-08-14: qty 2

## 2024-08-14 MED ORDER — FLUTICASONE PROPIONATE 50 MCG/ACT NA SUSP
1.0000 | Freq: Every day | NASAL | Status: DC
  Filled 2024-08-14: qty 16

## 2024-08-14 MED ORDER — CHLORHEXIDINE GLUCONATE 4 % EX SOLN
60.0000 mL | Freq: Once | CUTANEOUS | Status: DC
  Administered 2024-08-15: 4 via TOPICAL
  Filled 2024-08-14: qty 15

## 2024-08-14 MED ORDER — HYALURONIDASE HUMAN 150 UNIT/ML IJ SOLN
150.0000 [IU] | Freq: Once | INTRAMUSCULAR | Status: AC
  Administered 2024-08-14: 150 [IU] via SUBCUTANEOUS
  Filled 2024-08-14: qty 1

## 2024-08-14 MED ORDER — SODIUM CHLORIDE 0.9 % IV SOLN: INTRAVENOUS | Status: DC

## 2024-08-14 MED ORDER — BUDESON-GLYCOPYRROL-FORMOTEROL 160-9-4.8 MCG/ACT IN AERO
2.0000 | INHALATION_SPRAY | Freq: Two times a day (BID) | RESPIRATORY_TRACT | Status: DC
  Administered 2024-08-15 – 2024-08-16 (×2): 2 via RESPIRATORY_TRACT
  Filled 2024-08-14: qty 5.9

## 2024-08-14 MED ORDER — ZINC SULFATE 220 (50 ZN) MG PO CAPS
220.0000 mg | ORAL_CAPSULE | Freq: Every day | ORAL | Status: DC
  Administered 2024-08-14 – 2024-08-17 (×4): 220 mg via ORAL
  Filled 2024-08-14 (×4): qty 1

## 2024-08-14 NOTE — Progress Notes (Addendum)
 "  [facility] PROGRESS NOTE   Penny Munoz FMW:[id number] DOB: 10/09/1950 DOA: 08/13/2024  PCP: Penny Rock HERO, MD  Brief History: 74 y.o. female with medical history significant for systolic CHF, COPD, ventricular fibrillation, OSA on CPAP, bipolar disorder, asthma. Patient was admitted to [facility] 12/30 to 12/31.  Patient went to her EP clinic for acute visit -ICD device alert.  Device interrogation - complete heart block with escape in the mid 30s, requiring high RV pacing,was in V-fib earlier with ICD shock x 1, there was also concern for RV lead failure.[name]  She was subsequently referred to the ED at [facility] and subsequently admitted.  She underwent right and left cardiac cath-12/30, without angiographically significant CAD.  Started on amiodarone drip , and transferred to [facility] for device upgrade versus extraction/reimplantation.  Consultants: Cardiology    Subjective/Interval History: Patient lying comfortably on the bed.  Denies any chest pain or shortness of breath.  No nausea or vomiting.    Assessment/Plan:  Malfunction of ventricular ICD/ventricular fibrillation Management per cardiology.  Electrophysiology is following. Patient noted to be on amiodarone infusion.  Acute on chronic systolic CHF Apparently was noncompliant with her daily furosemide .  Received IV furosemide  at [facility].  Not on any IV diuretics currently. Noted to be on Entresto  and spironolactone . On 3 L of oxygen via nasal cannula with saturations in the mid to late 90s. Monitor electrolytes and renal function. Cardiac cath did not show any evidence for significant CAD. No indication for antiplatelets at this time according to cardiology.  LDL noted to be 104.  Consider statin at discharge.  History of COPD Stable.  Continue inhalers.  History of bipolar disorder Stable.  Continue home medications.  Obstructive sleep apnea CPAP  Obesity Estimated body mass index is 47.59 kg/m  as calculated from the following:   Height as of this encounter: 5' 8 (1.727 m).   Weight as of this encounter: 142 kg.   DVT Prophylaxis: SCDs Code Status: Full code Family Communication: Discussed with patient Disposition Plan: Home when cleared by cardiology  Status is: Inpatient Remains inpatient appropriate because: Ventricular fibrillation, malfunctioning ICD   Medications: Scheduled:  allopurinol  300 mg Oral Daily   citalopram  20 mg Oral Daily   sacubitril -valsartan   1 tablet Oral BID   spironolactone   25 mg Oral Daily   Continuous:  amiodarone 30 mg/hr (08/14/24 0930)   PRN:acetaminophen  **OR** acetaminophen , ipratropium-albuterol, ondansetron **OR** ondansetron (ZOFRAN) IV, polyethylene glycol   Objective:  Vital Signs  Vitals:   08/13/24 2307 08/13/24 2308 08/14/24 0410 08/14/24 0748  BP:  121/76 99/74 113/86  Pulse: 77  70 70  Resp:   18 18  Temp:  98.7 F (37.1 C) 98.7 F (37.1 C) 97.6 F (36.4 C)  TempSrc:  Oral Oral Oral  SpO2:  95% 96% 98%  Weight:      Height:        Intake/Output Summary (Last 24 hours) at 08/14/2024 1124 Last data filed at 08/14/2024 0411 Gross per 24 hour  Intake --  Output 400 ml  Net -400 ml   Filed Weights   08/13/24 2101  Weight: (!) 142 kg    General appearance: Awake alert.  In no distress Resp: Clear to auscultation bilaterally.  Normal effort Cardio: S1-S2 is normal regular.  No S3-S4.  No rubs murmurs or bruit GI: Abdomen is soft.  Nontender nondistended.  Bowel sounds are present normal.  No masses organomegaly Extremities: No edema.  Full  range of motion of lower extremities. Neurologic: Alert and oriented x3.  No focal neurological deficits.    Lab Results:  Data Reviewed: I have personally reviewed following labs and reports of the imaging studies  CBC: Recent Labs  Lab 08/12/24 1116 08/12/24 1649 08/12/24 1654 08/13/24 0252 08/14/24 0625  WBC 7.1  --   --  7.7 9.3  HGB 13.8 13.6 13.9 13.2  14.4  HCT 41.1 40.0 41.0 39.6 43.2  MCV 90.5  --   --  90.8 92.7  PLT 224  --   --  215 230    Basic Metabolic Panel: Recent Labs  Lab 08/12/24 1116 08/12/24 1649 08/12/24 1654 08/13/24 0252 08/14/24 0625  NA 138 140 140 139 139  K 4.1 3.6 3.7 3.5 4.4  CL 103  --   --  104 104  CO2 22  --   --  22 22  GLUCOSE 111*  --   --  99 115*  BUN 21  --   --  20 23  CREATININE 0.99  --   --  0.83 1.06*  CALCIUM 10.1  --   --  9.5 9.3  MG 2.1  --   --   --   --     GFR: Estimated Creatinine Clearance: 72 mL/min (A) (by C-G formula based on SCr of 1.06 mg/dL (H)).  Liver Function Tests: Recent Labs  Lab 08/13/24 0252  AST 17  ALT 16  ALKPHOS 104  BILITOT 0.4  PROT 6.9  ALBUMIN 3.9    Coagulation Profile: Recent Labs  Lab 08/12/24 1116  INR 1.1    BNP (last 3 results) Recent Labs    08/12/24 1116  PROBNP 1,068.0*    CBG: Recent Labs  Lab 08/12/24 1825 08/13/24 0747  GLUCAP 86 111*    Lipid Profile: Recent Labs    08/12/24 1116  CHOL 178  HDL 38*  LDLCALC 104*  TRIG 180*  CHOLHDL 4.7    Thyroid Function Tests: Recent Labs    08/13/24 0252  TSH 1.290  FREET4 1.09    Recent Results (from the past 240 hours)  MRSA Next Gen by PCR, Nasal     Status: None   Collection Time: 08/12/24  6:24 PM   Specimen: Nasal Mucosa; Nasal Swab  Result Value Ref Range Status   MRSA by PCR Next Gen NOT DETECTED NOT DETECTED Final    Comment: (NOTE) The GeneXpert MRSA Assay (FDA approved for NASAL specimens only), is one component of a comprehensive MRSA colonization surveillance program. It is not intended to diagnose MRSA infection nor to guide or monitor treatment for MRSA infections. Test performance is not FDA approved in patients less than 12 years old. Performed at [facility], [address], [place], [place] [address]       Radiology Studies: ECHOCARDIOGRAM COMPLETE Result Date: 08/13/2024    ECHOCARDIOGRAM REPORT   Patient Name:    Penny Munoz Date of Exam: 08/13/2024 Medical Rec #:  [id number]             Height:       68.0 in Accession #:    [id number]            Weight:       316.4 lb Date of Birth:  12/28/1950             BSA:          2.484 m Patient Age:    73 years  BP:           118/79 mmHg Patient Gender: F                     HR:           70 bpm. Exam Location:  [facility] Procedure: 2D Echo, Cardiac Doppler and Color Doppler (Both Spectral and Color            Flow Doppler were utilized during procedure). Indications:     Congestive heart failure I50.9                  Malfunction of implantable defibrillator ventricular ICD lead.  History:         Patient has prior history of Echocardiogram examinations, most                  recent 04/25/2023. CHF, COPD; Risk Factors:Hypertension.  Sonographer:     Christopher Furnace Referring Phys:  [phone] CHRISTOPHER END Diagnosing Phys: Evalene Lunger MD  Sonographer Comments: Technically challenging study due to limited acoustic windows, no subcostal window and no apical window. IMPRESSIONS  1. Left ventricular ejection fraction, by estimation, is 35 to 40%. The left ventricle has moderately decreased function. The left ventricle demonstrates global hypokinesis. The left ventricular internal cavity size was moderately dilated. Left ventricular diastolic parameters are indeterminate.  2. Right ventricular systolic function is normal. The right ventricular size is normal. There is normal pulmonary artery systolic pressure. The estimated right ventricular systolic pressure is 18.8 mmHg.  3. The mitral valve is normal in structure. No evidence of mitral valve regurgitation. No evidence of mitral stenosis.  4. The aortic valve is normal in structure. Aortic valve regurgitation is not visualized. No aortic stenosis is present.  5. The inferior vena cava is normal in size with greater than 50% respiratory variability, suggesting right atrial pressure of 3 mmHg. FINDINGS  Left Ventricle: Left  ventricular ejection fraction, by estimation, is 35 to 40%. The left ventricle has moderately decreased function. The left ventricle demonstrates global hypokinesis. Strain was performed and the global longitudinal strain is indeterminate. The left ventricular internal cavity size was moderately dilated. There is no left ventricular hypertrophy. Left ventricular diastolic parameters are indeterminate. Right Ventricle: The right ventricular size is normal. No increase in right ventricular wall thickness. Right ventricular systolic function is normal. There is normal pulmonary artery systolic pressure. The tricuspid regurgitant velocity is 1.86 m/s, and  with an assumed right atrial pressure of 5 mmHg, the estimated right ventricular systolic pressure is 18.8 mmHg. Left Atrium: Left atrial size was normal in size. Right Atrium: Right atrial size was normal in size. Pericardium: There is no evidence of pericardial effusion. Mitral Valve: The mitral valve is normal in structure. No evidence of mitral valve regurgitation. No evidence of mitral valve stenosis. Tricuspid Valve: The tricuspid valve is normal in structure. Tricuspid valve regurgitation is not demonstrated. No evidence of tricuspid stenosis. Aortic Valve: The aortic valve is normal in structure. Aortic valve regurgitation is not visualized. No aortic stenosis is present. Pulmonic Valve: The pulmonic valve was normal in structure. Pulmonic valve regurgitation is not visualized. No evidence of pulmonic stenosis. Aorta: The aortic root is normal in size and structure. Venous: The inferior vena cava is normal in size with greater than 50% respiratory variability, suggesting right atrial pressure of 3 mmHg. IAS/Shunts: No atrial level shunt detected by color flow Doppler. Additional Comments: 3D was performed not requiring image post processing  on an independent workstation and was indeterminate. A device lead is visualized.  LEFT VENTRICLE PLAX 2D LVIDd:          4.50 cm LVIDs:         3.50 cm LV PW:         1.60 cm LV IVS:        1.50 cm LVOT diam:     2.00 cm LVOT Area:     3.14 cm  LEFT ATRIUM         Index LA diam:    3.80 cm 1.53 cm/m   AORTA Ao Root diam: 2.70 cm TRICUSPID VALVE TR Peak grad:   13.8 mmHg TR Vmax:        186.00 cm/s  SHUNTS Systemic Diam: 2.00 cm Evalene Lunger MD Electronically signed by Evalene Lunger MD Signature Date/Time: 08/13/2024/1:47:07 PM    Final    CARDIAC CATHETERIZATION Result Date: 08/12/2024   There is moderate to severe left ventricular systolic dysfunction.   LV end diastolic pressure is mildly elevated.   There is no aortic valve stenosis.   Anticipated discharge date to be determined.   No indication for antiplatelet therapy at this time . Conclusions: No angiographically significant coronary artery disease. Moderately to severely reduced left ventricular systolic function (evaluation limited by suboptimal opacification of the left ventricle). Mildly elevated left heart filling pressures (PCWP 19, LVEDP 20 mmHg). Moderately elevated right heart filling pressures (mean RA/RV EDP 12 mmHg). Mild pulmonary hypertension (PA 36/19, mean 25 mmHg). Normal Fick cardiac output/index (CO 6.3 L/min, CI 2.6 L/min/m). Recommendations: Maintain net even to slightly negative fluid balance. Continue goal-directed medical therapy for nonischemic cardiomyopathy. Further management of VF and concern for ICD RV lead dysfunction per electrophysiology. Lonni Hanson, MD [facility]  DG Chest 2 View Result Date: 08/12/2024 CLINICAL DATA:  Chest pain.  Shortness of breath. EXAM: CHEST - 2 VIEW COMPARISON:  May 30, 2017. FINDINGS: Stable cardiomegaly. Left-sided defibrillator is unchanged. Both lungs are clear. The visualized skeletal structures are unremarkable. IMPRESSION: No active cardiopulmonary disease. Electronically Signed   By: Lynwood Landy Raddle M.D.   On: 08/12/2024 13:20   CUP PACEART INCLINIC DEVICE CHECK Result Date:  08/12/2024 Abnormal in-clinic _single__ chamber ICD check. Presenting Rhythm: _VP__ . Routine testing was performed. Threshold stable. RV integrity warning for elevated impedence, high rate NS episodes. VF episode 12/30, appears appropriate. HV therapy x 1 with clean break to VS/VP. See OV Note for further discussion. Estimated longevity _19 months___ . Pt is not enrolled in remote follow-up. CANDIE Needle, NP      LOS: 1 day   Javana Schey  [facility] Pager on [email or web address]  08/14/2024, 11:24 AM   "

## 2024-08-14 NOTE — Plan of Care (Signed)

## 2024-08-14 NOTE — Plan of Care (Signed)

## 2024-08-14 NOTE — Progress Notes (Addendum)
"  °  Progress Note  Patient Name: Penny Munoz Date of Encounter: 08/14/2024 [facility] Cardiologist: Deatrice Cage, MD   Interval Summary   Transferred from [facility] yesterday evening. No acute overnight events. Patient reports feeling relatively well. No new or acute complaints.   Vital Signs Vitals:   08/13/24 2307 08/13/24 2308 08/14/24 0410 08/14/24 0748  BP:  121/76 99/74 113/86  Pulse: 77  70 70  Resp:   18 18  Temp:  98.7 F (37.1 C) 98.7 F (37.1 C) 97.6 F (36.4 C)  TempSrc:  Oral Oral Oral  SpO2:  95% 96% 98%  Weight:      Height:        Intake/Output Summary (Last 24 hours) at 08/14/2024 0920 Last data filed at 08/14/2024 0411 Gross per 24 hour  Intake --  Output 400 ml  Net -400 ml      08/13/2024    9:01 PM 08/13/2024    5:00 AM 08/12/2024    6:41 PM  Last 3 Weights  Weight (lbs) 313 lb 316 lb 5.8 oz 316 lb 2.2 oz  Weight (kg) 141.976 kg 143.5 kg 143.4 kg      Telemetry/ECG  VP, some PVCs - Personally Reviewed  Physical Exam  General: Well developed, in no acute distress.  Neck: No JVD.  Cardiac: Normal rate, regular rhythm.  Resp: Normal work of breathing.  Ext: No edema.  Neuro: No gross focal deficits.  Psych: Normal affect.   Assessment & Plan  Ms.Penny Munoz is a 74 year old female with past medical history notable for chronic systolic heart failure status post ICD who presented to clinic yesterday following a device shock.  Interrogation of her device notes ventricular fibrillation.  She was also found to be in new complete heart block with increased ventricular pacing over the past couple of weeks.  On ECG she had ventricular pacing at a backup rate of VVI 40 bpm with frequent competitive PVCs and possibly intermittently conducted beats.  I suspect that she had a bradycardia mediated R on T event precipitating her ventricular fibrillation.  Patient had left heart cath which did not show significant coronary disease to account  for her VF.  There was a [employer] lead integrity alert given the short VV intervals, but there was no evidence of obvious lead noise.  All of the lead parameters are stable and appropriate.   Problem List:  #ICD shock #VF #Complete heart block #Chronic systolic heart failure   Plan:  - Patient needs upgrade to CRT-D given her complete heart block and low LVEF.  Will need addition of RA and CS leads.  Tentatively to be done tomorrow with Dr. Waddell. NPO after midnight. - Continue amiodarone for PVC suppression and increase pacing rate to 70 bpm.  Plan would be for amiodarone to be short-term while we await device upgrade.  Signed, Fonda Kitty, MD    "

## 2024-08-15 ENCOUNTER — Other Ambulatory Visit: Payer: Self-pay

## 2024-08-15 ENCOUNTER — Inpatient Hospital Stay (HOSPITAL_COMMUNITY): Admission: RE | Disposition: A | Payer: Self-pay | Source: Other Acute Inpatient Hospital | Attending: Internal Medicine

## 2024-08-15 ENCOUNTER — Encounter (HOSPITAL_COMMUNITY): Payer: Self-pay | Admitting: Internal Medicine

## 2024-08-15 ENCOUNTER — Encounter: Payer: Self-pay | Admitting: Emergency Medicine

## 2024-08-15 DIAGNOSIS — I5022 Chronic systolic (congestive) heart failure: Secondary | ICD-10-CM

## 2024-08-15 DIAGNOSIS — I428 Other cardiomyopathies: Secondary | ICD-10-CM

## 2024-08-15 DIAGNOSIS — I4901 Ventricular fibrillation: Secondary | ICD-10-CM | POA: Diagnosis not present

## 2024-08-15 DIAGNOSIS — I442 Atrioventricular block, complete: Secondary | ICD-10-CM | POA: Diagnosis not present

## 2024-08-15 DIAGNOSIS — I5023 Acute on chronic systolic (congestive) heart failure: Secondary | ICD-10-CM | POA: Diagnosis not present

## 2024-08-15 HISTORY — PX: BIV UPGRADE: EP1202

## 2024-08-15 LAB — BASIC METABOLIC PANEL WITH GFR
Anion gap: 11 (ref 5–15)
BUN: 23 mg/dL (ref 8–23)
CO2: 23 mmol/L (ref 22–32)
Calcium: 9.2 mg/dL (ref 8.9–10.3)
Chloride: 102 mmol/L (ref 98–111)
Creatinine, Ser: 0.87 mg/dL (ref 0.44–1.00)
GFR, Estimated: >60 mL/min
Glucose, Bld: 94 mg/dL (ref 70–99)
Potassium: 4 mmol/L (ref 3.5–5.1)
Sodium: 136 mmol/L (ref 135–145)

## 2024-08-15 LAB — CBC
HCT: 40.9 % (ref 36.0–46.0)
Hemoglobin: 13.7 g/dL (ref 12.0–15.0)
MCH: 30.9 pg (ref 26.0–34.0)
MCHC: 33.5 g/dL (ref 30.0–36.0)
MCV: 92.3 fL (ref 80.0–100.0)
Platelets: 193 K/uL (ref 150–400)
RBC: 4.43 MIL/uL (ref 3.87–5.11)
RDW: 14.2 % (ref 11.5–15.5)
WBC: 8.2 K/uL (ref 4.0–10.5)
nRBC: 0 % (ref 0.0–0.2)

## 2024-08-15 LAB — MAGNESIUM: Magnesium: 1.9 mg/dL (ref 1.7–2.4)

## 2024-08-15 MED ORDER — IOHEXOL 350 MG/ML SOLN
INTRAVENOUS | Status: DC | PRN
  Administered 2024-08-15: 15 mL
  Administered 2024-08-15: 10 mL
  Administered 2024-08-15: 5 mL

## 2024-08-15 MED ORDER — MIDAZOLAM HCL 2 MG/2ML IJ SOLN
INTRAMUSCULAR | Status: AC
  Filled 2024-08-15: qty 2

## 2024-08-15 MED ORDER — LIDOCAINE HCL (PF) 1 % IJ SOLN
INTRAMUSCULAR | Status: AC
  Filled 2024-08-15: qty 60

## 2024-08-15 MED ORDER — MIDAZOLAM HCL 5 MG/5ML IJ SOLN
INTRAMUSCULAR | Status: DC | PRN
  Administered 2024-08-15 (×5): 1 mg via INTRAVENOUS

## 2024-08-15 MED ORDER — SODIUM CHLORIDE 0.9 % IV SOLN
INTRAVENOUS | Status: AC
  Filled 2024-08-15: qty 2

## 2024-08-15 MED ORDER — PANTOPRAZOLE SODIUM 40 MG PO TBEC
40.0000 mg | DELAYED_RELEASE_TABLET | Freq: Every day | ORAL | Status: DC
  Administered 2024-08-15 – 2024-08-17 (×3): 40 mg via ORAL
  Filled 2024-08-15 (×3): qty 1

## 2024-08-15 MED ORDER — HEPARIN (PORCINE) IN NACL 1000-0.9 UT/500ML-% IV SOLN
INTRAVENOUS | Status: DC | PRN
  Administered 2024-08-15: 500 mL

## 2024-08-15 MED ORDER — HYDROCODONE-ACETAMINOPHEN 5-325 MG PO TABS
1.0000 | ORAL_TABLET | Freq: Four times a day (QID) | ORAL | Status: DC | PRN
  Administered 2024-08-15 – 2024-08-17 (×8): 1 via ORAL
  Filled 2024-08-15 (×8): qty 1

## 2024-08-15 MED ORDER — LIDOCAINE HCL (PF) 1 % IJ SOLN
INTRAMUSCULAR | Status: DC | PRN
  Administered 2024-08-15: 30 mL
  Administered 2024-08-15: 60 mL

## 2024-08-15 MED ORDER — FENTANYL CITRATE (PF) 100 MCG/2ML IJ SOLN
INTRAMUSCULAR | Status: AC
  Filled 2024-08-15: qty 2

## 2024-08-15 MED ORDER — FENTANYL CITRATE (PF) 100 MCG/2ML IJ SOLN
INTRAMUSCULAR | Status: DC | PRN
  Administered 2024-08-15 (×6): 12.5 ug via INTRAVENOUS

## 2024-08-15 MED ORDER — ALUM & MAG HYDROXIDE-SIMETH 200-200-20 MG/5ML PO SUSP
30.0000 mL | ORAL | Status: DC | PRN
  Administered 2024-08-15 (×2): 30 mL via ORAL
  Filled 2024-08-15 (×2): qty 30

## 2024-08-15 MED ORDER — CALCIUM CARBONATE ANTACID 500 MG PO CHEW
1.0000 | CHEWABLE_TABLET | Freq: Three times a day (TID) | ORAL | Status: DC | PRN
  Administered 2024-08-15 – 2024-08-16 (×2): 200 mg via ORAL
  Filled 2024-08-15 (×3): qty 1

## 2024-08-15 MED ORDER — CEFAZOLIN SODIUM-DEXTROSE 1-4 GM/50ML-% IV SOLN
1.0000 g | Freq: Four times a day (QID) | INTRAVENOUS | Status: AC
  Administered 2024-08-15 – 2024-08-16 (×3): 1 g via INTRAVENOUS
  Filled 2024-08-15 (×3): qty 50

## 2024-08-15 NOTE — Progress Notes (Signed)
"   OT Cancellation Note  Patient Details Name: Penny Munoz MRN: [id number] DOB: 10/02/1950   Cancelled Treatment:    Reason Eval/Treat Not Completed: Patient at procedure or test/ unavailable (BIV update)  Ely Molt 08/15/2024, 11:40 AM "

## 2024-08-15 NOTE — TOC Initial Note (Signed)
 Transition of Care [facility]) - Initial/Assessment Note    Patient Details  Name: Penny Munoz MRN: [id number] Date of Birth: 1951-06-06  [facility] CM/SW Contact:    Sudie Erminio Deems, RN Phone Number: 08/15/2024, 10:48 AM  Clinical Narrative: Patient presented for biventricular defibrillator dysfunction-plan for BIV upgrade today. PTA patient was from home with spouse and grandson. Patient is the caregiver to the spouse. Patient has DME cane in the home. Patient has PCP and she states her son drives her to appointments. Patient states her spouse receives a personal care aid from the [place]. ICM discussed home health with the patient and she will benefit from PT/OT consult- orders placed. Awaiting PT/OT consult. ICM suggested patient call the [facility] CSW to see if she can qualify for any VA Benefits through spouse. Daughter is visiting from out of town and will return home on Sunday. ICM will continue to follow for additional disposition needs as the patient progresses.                  Expected Discharge Plan: Home w Home Health Services Barriers to Discharge: Continued Medical Work up   Patient Goals and CMS Choice Patient states their goals for this hospitalization and ongoing recovery are:: Patient feels deconditioned and wants to feel stronger.  Expected Discharge Plan and Services In-house Referral: NA Discharge Planning Services: CM Consult Post Acute Care Choice: Home Health Living arrangements for the past 2 months: Single Family Home                   DME Agency: NA  Prior Living Arrangements/Services Living arrangements for the past 2 months: Single Family Home Lives with:: Spouse, Relatives Patient language and need for interpreter reviewed:: Yes Do you feel safe going back to the place where you live?: Yes      Need for Family Participation in Patient Care: Yes (Comment) Care giver support system in place?: Yes (comment) Current home services: DME  (cane) Criminal Activity/Legal Involvement Pertinent to Current Situation/Hospitalization: No - Comment as needed  Permission Sought/Granted Permission sought to share information with : Family Supports, Case Manager   Emotional Assessment Appearance:: Appears stated age Attitude/Demeanor/Rapport: Engaged Affect (typically observed): Appropriate Orientation: : Oriented to Self, Oriented to Place, Oriented to  Time, Oriented to Situation Alcohol / Substance Use: Not Applicable Psych Involvement: No (comment)  Admission diagnosis:  Malfunction of biventricular implantable cardioverter-defibrillator (ICD) [T82.118A] Malfunction of biventricular automatic implantable cardioverter-defibrillator [T82.118A] Patient Active Problem List   Diagnosis Date Noted   AICD discharge 08/13/2024   CHB (complete heart block) (HCC) 08/13/2024   Malfunction of implantable defibrillator ventricular (ICD) lead 08/12/2024   Acute on chronic HFrEF (heart failure with reduced ejection fraction) (HCC) 08/12/2024   VF (ventricular fibrillation) (HCC) 08/12/2024   H/O cardiac catheterization 06/23/2016   ICD (implantable cardioverter-defibrillator) in place 06/23/2016   Abdominal pain 03/09/2016   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   Hypokalemia 11/17/2015   GAD (generalized anxiety disorder) 03/25/2015   Dyspnea on exertion 03/25/2015   Hypertension 03/25/2015   Chronic obstructive pulmonary disease (HCC) 12/12/2013   CHF (congestive heart failure) (HCC) 06/18/2013   Nonischemic cardiomyopathy (HCC) 06/18/2013   Bipolar disorder (HCC) 07/06/2009   GERD (gastroesophageal reflux disease) 07/06/2009   High cholesterol 07/06/2009   Obesity 07/06/2009   Obstructive sleep apnea 07/06/2009   Tear of meniscus of knee 02/02/2009   Cataracts, bilateral 10/31/2008   Asthma 08/14/1898   Overactive bladder 08/14/1898   PCP:  Buren,  Rock HERO, MD Pharmacy:   [facility] [address], [place] - [address] [place] [address] Phone: [phone] Fax: [phone]  [facility] [place], [place] - [address] [place] [address] Phone: [phone] Fax: [phone]  [facility] - [place], [place] - [address] [place] [address] Phone: [phone] Fax: [phone]  D[facility] LONG - [facility] [address] [place] [address] Phone: [phone] Fax: [phone]     Social Drivers of Health (SDOH) Social History: SDOH Screenings   Food Insecurity: No Food Insecurity (08/13/2024)  Housing: Low Risk (08/13/2024)  Transportation Needs: No Transportation Needs (08/13/2024)  Utilities: Not At Risk (08/13/2024)  Social Connections: Moderately Integrated (08/13/2024)  Tobacco Use: Medium Risk (08/12/2024)   SDOH Interventions:     Readmission Risk Interventions     No data to display

## 2024-08-15 NOTE — Discharge Instructions (Addendum)
 After Your ICD (Implantable Cardiac Defibrillator)   You have a Medtronic ICD  If you have a Medtronic or Biotronik device, plug in your home monitor once you get home, and no manual interaction is required.   If you have an [employer] or [employer] device, plug your home monitor once you get home, sit near the device, and press the large activation button. Sit nearby until the process is complete, usually notated by lights on the monitor.   If you were set up for monitoring using an app on your phone, make sure the app remains open in the background and the Bluetooth remains on.  ACTIVITY Do not lift your arm above shoulder height for 1 week after your procedure. After 7 days, you may progress as below.  You should remove your sling 24 hours after your procedure, unless otherwise instructed by your provider.     Friday August 22, 2024  Saturday August 23, 2024 Sunday August 24, 2024 Monday August 25, 2024   Do not lift, push, pull, or carry anything over 10 pounds with the affected arm until 6 weeks (Friday September 26, 2024 ) after your procedure.   You may drive AFTER your wound check, UNLESS you have been told otherwise by your provider.   Ask your healthcare provider when you can go back to work   INCISION/Dressing If you are on a blood thinner such as Coumadin, Xarelto, Eliquis, Plavix, or Pradaxa please confirm with your provider when this should be resumed.   If large square, outer bandage is left in place, this can be removed after 24 hours from your procedure. Do not remove steri-strips or glue as below.   Monitor your defibrillator site for redness, swelling, and drainage. Call the device clinic at [phone] if you experience these symptoms or fever/chills.    If your incision is sealed with Steri-strips or staples, you may shower 7 days after your procedure or when told by your provider. Do not remove the steri-strips or let the shower hit directly on your site.  You may wash around your site with soap and water.    If you were discharged in a sling, please do not wear this during the day more than 48 hours after your surgery unless otherwise instructed. This may increase the risk of stiffness and soreness in your shoulder.   Avoid lotions, ointments, or perfumes over your incision until it is well-healed.  You may use a hot tub or a pool AFTER your wound check appointment if the incision is completely closed.  Your ICD is designed to protect you from life threatening heart rhythms. Because of this, you may receive a shock.   1 shock with no symptoms:  Call the office during business hours. 1 shock with symptoms (chest pain, chest pressure, dizziness, lightheadedness, shortness of breath, overall feeling unwell):  Call 911. If you experience 2 or more shocks in 24 hours:  Call 911. If you receive a shock, you should not drive for 6 months per the [place] DMV IF you receive appropriate therapy from your ICD.   ICD Alerts:  Some alerts are vibratory and others beep. These are NOT emergencies. Please call our office to let us  know. If this occurs at night or on weekends, it can wait until the next business day. Send a remote transmission.  If your device is capable of reading fluid status (for heart failure), you will be offered monthly monitoring to review this with you.   DEVICE MANAGEMENT  Remote monitoring is used to monitor your ICD from home. This monitoring is scheduled every 91 days by our office. It allows us  to keep an eye on the functioning of your device to ensure it is working properly. You will routinely see your Electrophysiologist annually (more often if necessary). This will appear as a REMOTE check on your MyChart schedule. These are automatic and there is nothing for you to manually do unless otherwise instructed.  You should receive your ID card for your new device in 4-8 weeks. Keep this card with you at all times once received. Consider  wearing a medical alert bracelet or necklace.  Your ICD  may be MRI compatible. This will be discussed at your next office visit/wound check.  You should avoid contact with strong electric or magnetic fields.   Do not use amateur (ham) radio equipment or electric (arc) welding torches. MP3 player headphones with magnets should not be used. Some devices are safe to use if held at least 12 inches (30 cm) from your defibrillator. These include power tools, lawn mowers, and speakers. If you are unsure if something is safe to use, ask your health care provider.  When using your cell phone, hold it to the ear that is on the opposite side from the defibrillator. Do not leave your cell phone in a pocket over the defibrillator.  You may safely use electric blankets, heating pads, computers, and microwave ovens.  Call the office right away if: You have chest pain. You feel more than one shock. You feel more short of breath than you have felt before. You feel more light-headed than you have felt before. Your incision starts to open up.  This information is not intended to replace advice given to you by your health care provider. Make sure you discuss any questions you have with your health care provider.

## 2024-08-15 NOTE — Progress Notes (Signed)
 Pt complained of discomfort in throat and chest. States that her lungs hurt. Feels like she has air in her chest. Hard to fully describe her discomfort. Pt states that after her inhaler she started feeing some discomfort in her lungs. This RN assessed pt's lung sounds which were clear and diminished. This RN gave pt her prn Tums, requested the CNA to obtain an EKG, and notified the primary nurse.

## 2024-08-15 NOTE — Progress Notes (Addendum)
 "  Rounding Note   Patient Name: Penny Munoz Date of Encounter: 08/15/2024  [facility] Cardiologist: Deatrice Cage, MD   Subjective  I caught a cough here last night, denies SOB. No CP  Scheduled Meds:  allopurinol  300 mg Oral Daily   budesonide-glycopyrrolate-formoterol  2 puff Inhalation BID    ceFAZolin (ANCEF) IV  3 g Intravenous On Call   chlorhexidine  60 mL Topical Once   citalopram  20 mg Oral Daily   fluticasone  1 spray Each Nare Daily   gentamicin (GARAMYCIN) 80 mg in sodium chloride  0.9 % 500 mL irrigation  80 mg Irrigation On Call   sacubitril -valsartan   1 tablet Oral BID   spironolactone   25 mg Oral Daily   zinc sulfate (50mg  elemental zinc)  220 mg Oral Daily   Continuous Infusions:  sodium chloride      amiodarone 30 mg/hr (08/15/24 0727)   PRN Meds: acetaminophen  **OR** acetaminophen , alum & mag hydroxide-simeth, ipratropium-albuterol, nicotine polacrilex, ondansetron **OR** ondansetron (ZOFRAN) IV, polyethylene glycol   Vital Signs  Vitals:   08/14/24 2022 08/15/24 0043 08/15/24 0628 08/15/24 0800  BP: (!) 139/93 115/61 91/76 (!) 123/58  Pulse: 72 71  (!) 39  Resp: 20 20 18 19   Temp: 99 F (37.2 C) 98 F (36.7 C) 97.9 F (36.6 C) 98.9 F (37.2 C)  TempSrc: Oral Oral Oral Oral  SpO2: 95% 98%  96%  Weight:      Height:        Intake/Output Summary (Last 24 hours) at 08/15/2024 0809 Last data filed at 08/15/2024 0325 Gross per 24 hour  Intake 804.7 ml  Output 1300 ml  Net -495.3 ml      08/13/2024    9:01 PM 08/13/2024    5:00 AM 08/12/2024    6:41 PM  Last 3 Weights  Weight (lbs) 313 lb 316 lb 5.8 oz 316 lb 2.2 oz  Weight (kg) 141.976 kg 143.5 kg 143.4 kg      Telemetry   V pacing/PVCs, mostly bigeminal vs some conducted beats,  - Personally Reviewed  ECG   No new EKGs - Personally Reviewed  Physical Exam  GEN: No acute distress.   Neck: No JVD Cardiac: Regularly irregular, no murmurs, rubs, or gallops.   Respiratory: Clear to auscultation bilaterally. GI: Soft, nontender, obese MS: No edema; No deformity. Neuro:  Nonfocal  Psych: Normal affect   Labs High Sensitivity Troponin:  No results for input(s): TROPONINIHS in the last 720 hours.  Recent Labs  Lab 08/12/24 1116  TRNPT 17       Chemistry Recent Labs  Lab 08/12/24 1116 08/12/24 1649 08/13/24 0252 08/14/24 0625 08/15/24 0347  NA 138   < > 139 139 136  K 4.1   < > 3.5 4.4 4.0  CL 103  --  104 104 102  CO2 22  --  22 22 23   GLUCOSE 111*  --  99 115* 94  BUN 21  --  20 23 23   CREATININE 0.99  --  0.83 1.06* 0.87  CALCIUM 10.1  --  9.5 9.3 9.2  MG 2.1  --   --   --   --   PROT  --   --  6.9  --   --   ALBUMIN  --   --  3.9  --   --   AST  --   --  17  --   --   ALT  --   --  16  --   --   ALKPHOS  --   --  104  --   --   BILITOT  --   --  0.4  --   --   GFRNONAA 60*  --  >60 56* >60  ANIONGAP 13  --  13 13 11    < > = values in this interval not displayed.    Lipids  Recent Labs  Lab 08/12/24 1116  CHOL 178  TRIG 180*  HDL 38*  LDLCALC 104*  CHOLHDL 4.7    Hematology Recent Labs  Lab 08/13/24 0252 08/14/24 0625 08/15/24 0347  WBC 7.7 9.3 8.2  RBC 4.36 4.66 4.43  HGB 13.2 14.4 13.7  HCT 39.6 43.2 40.9  MCV 90.8 92.7 92.3  MCH 30.3 30.9 30.9  MCHC 33.3 33.3 33.5  RDW 14.3 14.4 14.2  PLT 215 230 193   Thyroid  Recent Labs  Lab 08/13/24 0252  TSH 1.290  FREET4 1.09    BNP Recent Labs  Lab 08/12/24 1116  PROBNP 1,068.0*    DDimer No results for input(s): DDIMER in the last 168 hours.   Radiology  No results found.  Cardiac Studies  08/12/24: LHC There is moderate to severe left ventricular systolic dysfunction.   LV end diastolic pressure is mildly elevated.   There is no aortic valve stenosis.   Anticipated discharge date to be determined.   No indication for antiplatelet therapy at this time .   Conclusions: No angiographically significant coronary artery  disease. Moderately to severely reduced left ventricular systolic function (evaluation limited by suboptimal opacification of the left ventricle). Mildly elevated left heart filling pressures (PCWP 19, LVEDP 20 mmHg). Moderately elevated right heart filling pressures (mean RA/RV EDP 12 mmHg). Mild pulmonary hypertension (PA 36/19, mean 25 mmHg). Normal Fick cardiac output/index (CO 6.3 L/min, CI 2.6 L/min/m).   Recommendations: Maintain net even to slightly negative fluid balance. Continue goal-directed medical therapy for nonischemic cardiomyopathy. Further management of VF and concern for ICD RV lead dysfunction per electrophysiology.   08/13/24: TTE  1. Left ventricular ejection fraction, by estimation, is 35 to 40%. The  left ventricle has moderately decreased function. The left ventricle  demonstrates global hypokinesis. The left ventricular internal cavity size  was moderately dilated. Left  ventricular diastolic parameters are indeterminate.   2. Right ventricular systolic function is normal. The right ventricular  size is normal. There is normal pulmonary artery systolic pressure. The  estimated right ventricular systolic pressure is 18.8 mmHg.   3. The mitral valve is normal in structure. No evidence of mitral valve  regurgitation. No evidence of mitral stenosis.   4. The aortic valve is normal in structure. Aortic valve regurgitation is  not visualized. No aortic stenosis is present.   5. The inferior vena cava is normal in size with greater than 50%  respiratory variability, suggesting right atrial pressure of 3 mmHg.   Patient Profile   74 y.o. 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  NICM, chronic CHF  Admitted to [facility] for VF New CHB Acute/chronic CHF  LHC w/o obstructive CAD LVEF 35-40%  Assessment & Plan    VF Appropriate tx via her device No obstructive CAD by cath Long  hx of NICM, LVEF stable Suspect perhaps bradycardia mediated R on T event precipitating her ventricular fibrillation  High PVC burden on telemetry Continue amiodarone gtt for now >> suspect once AV  synchrony is established these may go away/get better  CHB New for her, continues despite off coreg   ICD There had been a lead integrity alert that prompted her visit to the office Appears to have been triggered by short V-V intervals In review with EP MD > no noise noted in review of office interrogation done 12/30 There was mention perhaps of abnormal impedance, though appears stable 12/27: (prompting alert): episode appears to be true arrhythmia (not noise) I do not see any out of range impedance measurements    NICM Chronic CHF (HFrEF) COPD OSA Cough this AM, not SOB Not typically on O2 at home > 2L here Volume status is difficult by exam She is able to lay flat this AM I/O is negative 1,358ml cumulatively since here She reports very good urine OP No wheezing appreciated off O2 > 96-97% as we are talking Will order CPAP for procedure/tonight   For questions or updates, please contact [facility] Please consult [email or web address] for contact info under    Signed, Charlies Macario Arthur, PA-C  08/15/2024, 8:09 AM    EP Attending  Patient seen and examined. Chart reviewed. CHB continues. No VT., She will undergo Biv ICD upgrade. I am not convinced that she has a lead problem though I cannot rule out double counting. She does have VF and CHB. I have discussed the indications/risks/benefits/goals/expectations of the procedure with the patient and her daughter and she wishes to proceed.   Danelle Halima Fogal,MD "

## 2024-08-15 NOTE — Progress Notes (Signed)
 "  [facility] PROGRESS NOTE   Makynna Manocchio FMW:[id number] DOB: 1950-12-04 DOA: 08/13/2024  PCP: Buren Rock HERO, MD  Brief History: 74 y.o. female with medical history significant for systolic CHF, COPD, ventricular fibrillation, OSA on CPAP, bipolar disorder, asthma. Patient was admitted to [facility] 12/30 to 12/31.  Patient went to her EP clinic for acute visit -ICD device alert.  Device interrogation - complete heart block with escape in the mid 30s, requiring high RV pacing,was in V-fib earlier with ICD shock x 1, there was also concern for RV lead failure.[name]  She was subsequently referred to the ED at [facility] and subsequently admitted.  She underwent right and left cardiac cath-12/30, without angiographically significant CAD.  Started on amiodarone drip , and transferred to [facility] for device upgrade versus extraction/reimplantation.  Consultants: Cardiology    Subjective/Interval History: Patient feels thirsty.  Wants to know when her procedure will be.  Denies any chest pain shortness of breath.    Assessment/Plan:  Malfunction of ventricular ICD/ventricular fibrillation Management per cardiology.  Electrophysiology is following. Patient noted to be on amiodarone infusion. Patient noted to be n.p.o. for procedure later today.  Acute on chronic systolic CHF Echocardiogram showed LVEF of 35 to 40% with global hypokinesis.  Indeterminate diastolic parameters.  RV function was normal.  No significant valvular abnormalities noted. Apparently was noncompliant with her daily furosemide .  Received IV furosemide  at [facility].  Not on any IV diuretics currently. Noted to be on Entresto  and spironolactone . On 3 L of oxygen via nasal cannula with saturations in the mid to late 90s.  Prior to wean off to maintain sats greater than 90%.   Volume status difficult to assess at this time.  Weights to be checked daily.  Strict ins and outs. Monitor electrolytes and renal  function. Cardiac cath did not show any evidence for significant CAD. No indication for antiplatelets at this time according to cardiology.  LDL noted to be 104.  Consider statin at discharge.  History of COPD Stable.  Continue inhalers.  History of bipolar disorder Stable.  Continue home medications.  Obstructive sleep apnea CPAP  Obesity Estimated body mass index is 47.59 kg/m as calculated from the following:   Height as of this encounter: 5' 8 (1.727 m).   Weight as of this encounter: 142 kg.   DVT Prophylaxis: SCDs Code Status: Full code Family Communication: Discussed with patient Disposition Plan: Home when cleared by cardiology   Medications: Scheduled:  allopurinol  300 mg Oral Daily   budesonide-glycopyrrolate-formoterol  2 puff Inhalation BID    ceFAZolin (ANCEF) IV  3 g Intravenous On Call   chlorhexidine  60 mL Topical Once   citalopram  20 mg Oral Daily   fluticasone  1 spray Each Nare Daily   gentamicin (GARAMYCIN) 80 mg in sodium chloride  0.9 % 500 mL irrigation  80 mg Irrigation On Call   pantoprazole  40 mg Oral Daily   sacubitril -valsartan   1 tablet Oral BID   spironolactone   25 mg Oral Daily   zinc sulfate (50mg  elemental zinc)  220 mg Oral Daily   Continuous:  sodium chloride      amiodarone 30 mg/hr (08/15/24 0727)   PRN:acetaminophen  **OR** acetaminophen , alum & mag hydroxide-simeth, ipratropium-albuterol, nicotine polacrilex, ondansetron **OR** ondansetron (ZOFRAN) IV, polyethylene glycol   Objective:  Vital Signs  Vitals:   08/14/24 2022 08/15/24 0043 08/15/24 0628 08/15/24 0800  BP: (!) 139/93 115/61 91/76 (!) 123/58  Pulse: 72 71  (!)  39  Resp: 20 20 18 19   Temp: 99 F (37.2 C) 98 F (36.7 C) 97.9 F (36.6 C) 98.9 F (37.2 C)  TempSrc: Oral Oral Oral Oral  SpO2: 95% 98%  96%  Weight:      Height:        Intake/Output Summary (Last 24 hours) at 08/15/2024 0929 Last data filed at 08/15/2024 0815 Gross per 24 hour  Intake 804.7  ml  Output 1800 ml  Net -995.3 ml   Filed Weights   08/13/24 2101  Weight: (!) 142 kg    General appearance: Awake alert.  In no distress Resp: Clear to auscultation bilaterally.  Normal effort Cardio: S1-S2 is normal regular.  No S3-S4.  No rubs murmurs or bruit GI: Abdomen is soft.  Nontender nondistended.  Bowel sounds are present normal.  No masses organomegaly Extremities: No edema.  Full range of motion of lower extremities. Neurologic: Alert and oriented x3.  No focal neurological deficits.    Lab Results:  Data Reviewed: I have personally reviewed following labs and reports of the imaging studies  CBC: Recent Labs  Lab 08/12/24 1116 08/12/24 1649 08/12/24 1654 08/13/24 0252 08/14/24 0625 08/15/24 0347  WBC 7.1  --   --  7.7 9.3 8.2  HGB 13.8 13.6 13.9 13.2 14.4 13.7  HCT 41.1 40.0 41.0 39.6 43.2 40.9  MCV 90.5  --   --  90.8 92.7 92.3  PLT 224  --   --  215 230 193    Basic Metabolic Panel: Recent Labs  Lab 08/12/24 1116 08/12/24 1649 08/12/24 1654 08/13/24 0252 08/14/24 0625 08/15/24 0347  NA 138 140 140 139 139 136  K 4.1 3.6 3.7 3.5 4.4 4.0  CL 103  --   --  104 104 102  CO2 22  --   --  22 22 23   GLUCOSE 111*  --   --  99 115* 94  BUN 21  --   --  20 23 23   CREATININE 0.99  --   --  0.83 1.06* 0.87  CALCIUM 10.1  --   --  9.5 9.3 9.2  MG 2.1  --   --   --   --   --     GFR: Estimated Creatinine Clearance: 87.8 mL/min (by C-G formula based on SCr of 0.87 mg/dL).  Liver Function Tests: Recent Labs  Lab 08/13/24 0252  AST 17  ALT 16  ALKPHOS 104  BILITOT 0.4  PROT 6.9  ALBUMIN 3.9    Coagulation Profile: Recent Labs  Lab 08/12/24 1116  INR 1.1    BNP (last 3 results) Recent Labs    08/12/24 1116  PROBNP 1,068.0*    CBG: Recent Labs  Lab 08/12/24 1825 08/13/24 0747  GLUCAP 86 111*    Lipid Profile: Recent Labs    08/12/24 1116  CHOL 178  HDL 38*  LDLCALC 104*  TRIG 180*  CHOLHDL 4.7    Thyroid Function  Tests: Recent Labs    08/13/24 0252  TSH 1.290  FREET4 1.09    Recent Results (from the past 240 hours)  MRSA Next Gen by PCR, Nasal     Status: None   Collection Time: 08/12/24  6:24 PM   Specimen: Nasal Mucosa; Nasal Swab  Result Value Ref Range Status   MRSA by PCR Next Gen NOT DETECTED NOT DETECTED Final    Comment: (NOTE) The GeneXpert MRSA Assay (FDA approved for NASAL specimens only), is one component of  a comprehensive MRSA colonization surveillance program. It is not intended to diagnose MRSA infection nor to guide or monitor treatment for MRSA infections. Test performance is not FDA approved in patients less than 90 years old. Performed at [facility], [address] [place], [place] [address]   Surgical PCR screen     Status: None   Collection Time: 08/14/24  8:00 PM   Specimen: Nasal Mucosa; Nasal Swab  Result Value Ref Range Status   MRSA, PCR NEGATIVE NEGATIVE Final   Staphylococcus aureus NEGATIVE NEGATIVE Final    Comment: (NOTE) The Xpert SA Assay (FDA approved for NASAL specimens in patients 65 years of age and older), is one component of a comprehensive surveillance program. It is not intended to diagnose infection nor to guide or monitor treatment. Performed at [facility], [address] [place], [place] [address]       Radiology Studies: No results found.      LOS: 2 days   Ramone Gander Verdene  [facility] Pager on [email or web address]  08/15/2024, 9:29 AM   "

## 2024-08-16 ENCOUNTER — Inpatient Hospital Stay (HOSPITAL_COMMUNITY)

## 2024-08-16 ENCOUNTER — Encounter (HOSPITAL_COMMUNITY): Payer: Self-pay | Admitting: Internal Medicine

## 2024-08-16 ENCOUNTER — Inpatient Hospital Stay (HOSPITAL_COMMUNITY): Admission: RE | Disposition: A | Payer: Self-pay | Source: Other Acute Inpatient Hospital | Attending: Internal Medicine

## 2024-08-16 ENCOUNTER — Ambulatory Visit: Payer: Self-pay | Admitting: Cardiology

## 2024-08-16 DIAGNOSIS — I4901 Ventricular fibrillation: Secondary | ICD-10-CM | POA: Diagnosis not present

## 2024-08-16 DIAGNOSIS — I5023 Acute on chronic systolic (congestive) heart failure: Secondary | ICD-10-CM | POA: Diagnosis not present

## 2024-08-16 DIAGNOSIS — I442 Atrioventricular block, complete: Secondary | ICD-10-CM | POA: Diagnosis not present

## 2024-08-16 HISTORY — PX: LEAD REVISION/REPAIR: EP1213

## 2024-08-16 LAB — BASIC METABOLIC PANEL WITH GFR
Anion gap: 11 (ref 5–15)
BUN: 19 mg/dL (ref 8–23)
CO2: 23 mmol/L (ref 22–32)
Calcium: 9 mg/dL (ref 8.9–10.3)
Chloride: 101 mmol/L (ref 98–111)
Creatinine, Ser: 0.93 mg/dL (ref 0.44–1.00)
GFR, Estimated: >60 mL/min
Glucose, Bld: 104 mg/dL — ABNORMAL HIGH (ref 70–99)
Potassium: 4.2 mmol/L (ref 3.5–5.1)
Sodium: 135 mmol/L (ref 135–145)

## 2024-08-16 LAB — TROPONIN T, HIGH SENSITIVITY
Troponin T High Sensitivity: 68 ng/L — ABNORMAL HIGH (ref 0–19)
Troponin T High Sensitivity: 75 ng/L — ABNORMAL HIGH (ref 0–19)

## 2024-08-16 LAB — MAGNESIUM: Magnesium: 2.1 mg/dL (ref 1.7–2.4)

## 2024-08-16 MED ORDER — SODIUM CHLORIDE 0.9 % IV SOLN
INTRAVENOUS | Status: AC
  Administered 2024-08-16: 80 mg
  Filled 2024-08-16: qty 2

## 2024-08-16 MED ORDER — CEFAZOLIN SODIUM-DEXTROSE 3-4 GM/150ML-% IV SOLN
3.0000 g | INTRAVENOUS | Status: AC
  Administered 2024-08-16: 3 g via INTRAVENOUS
  Filled 2024-08-16 (×2): qty 150

## 2024-08-16 MED ORDER — MIDAZOLAM HCL 5 MG/5ML IJ SOLN
INTRAMUSCULAR | Status: DC | PRN
  Administered 2024-08-16 (×4): 1 mg via INTRAVENOUS

## 2024-08-16 MED ORDER — FENTANYL CITRATE (PF) 100 MCG/2ML IJ SOLN
INTRAMUSCULAR | Status: DC | PRN
  Administered 2024-08-16 (×4): 25 ug via INTRAVENOUS

## 2024-08-16 MED ORDER — LIDOCAINE HCL (PF) 1 % IJ SOLN
INTRAMUSCULAR | Status: DC | PRN
  Administered 2024-08-16: 50 mL

## 2024-08-16 MED ORDER — SODIUM CHLORIDE 0.9 % IV SOLN
80.0000 mg | INTRAVENOUS | Status: AC
  Filled 2024-08-16: qty 2

## 2024-08-16 MED ORDER — HEPARIN (PORCINE) IN NACL 1000-0.9 UT/500ML-% IV SOLN
INTRAVENOUS | Status: DC | PRN
  Administered 2024-08-16: 500 mL

## 2024-08-16 MED ORDER — POVIDONE-IODINE 10 % EX SWAB: 2.0000 | Freq: Once | CUTANEOUS | Status: DC

## 2024-08-16 MED ORDER — KETOROLAC TROMETHAMINE 15 MG/ML IJ SOLN: 15.0000 mg | Freq: Once | INTRAMUSCULAR | Status: DC

## 2024-08-16 MED ORDER — MIDAZOLAM HCL 2 MG/2ML IJ SOLN
INTRAMUSCULAR | Status: AC
  Filled 2024-08-16: qty 2

## 2024-08-16 MED ORDER — ASPIRIN 81 MG PO TBEC
81.0000 mg | DELAYED_RELEASE_TABLET | Freq: Every day | ORAL | Status: DC
  Administered 2024-08-16 – 2024-08-17 (×2): 81 mg via ORAL
  Filled 2024-08-16 (×2): qty 1

## 2024-08-16 MED ORDER — SODIUM CHLORIDE 0.9 % IV SOLN: INTRAVENOUS | Status: DC

## 2024-08-16 MED ORDER — ACETAMINOPHEN 325 MG PO TABS: 325.0000 mg | ORAL_TABLET | ORAL | Status: DC | PRN

## 2024-08-16 MED ORDER — LIDOCAINE HCL (PF) 1 % IJ SOLN
INTRAMUSCULAR | Status: AC
  Filled 2024-08-16: qty 30

## 2024-08-16 MED ORDER — FENTANYL CITRATE (PF) 100 MCG/2ML IJ SOLN
INTRAMUSCULAR | Status: AC
  Filled 2024-08-16: qty 2

## 2024-08-16 MED ORDER — CEFAZOLIN SODIUM-DEXTROSE 1-4 GM/50ML-% IV SOLN
1.0000 g | Freq: Four times a day (QID) | INTRAVENOUS | Status: AC
  Administered 2024-08-16 – 2024-08-17 (×3): 1 g via INTRAVENOUS
  Filled 2024-08-16 (×3): qty 50

## 2024-08-16 MED ORDER — FUROSEMIDE 10 MG/ML IJ SOLN
40.0000 mg | Freq: Once | INTRAMUSCULAR | Status: AC
  Administered 2024-08-16: 40 mg via INTRAVENOUS
  Filled 2024-08-16: qty 4

## 2024-08-16 NOTE — Progress Notes (Signed)
 PT Cancellation Note  Patient Details Name: Penny Munoz MRN: [id number] DOB: Jul 01, 1951   Cancelled Treatment:    Reason Eval/Treat Not Completed: Other (comment) (MD requesting hold. Plan to follow up when able.)   Leontine Hilt DPT Acute Rehab Services [phone] Prefer contact via chat   Leontine NOVAK Jonnae Fonseca 08/16/2024, 8:28 AM

## 2024-08-16 NOTE — Progress Notes (Signed)
 "  [facility] PROGRESS NOTE   Lyrical Sowle FMW:[id number] DOB: Feb 26, 1951 DOA: 08/13/2024  PCP: Buren Rock HERO, MD  Brief History: 74 y.o. female with medical history significant for systolic CHF, COPD, ventricular fibrillation, OSA on CPAP, bipolar disorder, asthma. Patient was admitted to [facility] 12/30 to 12/31.  Patient went to her EP clinic for acute visit -ICD device alert.  Device interrogation - complete heart block with escape in the mid 30s, requiring high RV pacing,was in V-fib earlier with ICD shock x 1, there was also concern for RV lead failure.[name]  She was subsequently referred to the ED at [facility] and subsequently admitted.  She underwent right and left cardiac cath-12/30, without angiographically significant CAD.  Started on amiodarone  drip , and transferred to [facility] for device upgrade versus extraction/reimplantation.  Consultants: Cardiology  Procedures: upgrade of a single-chamber ICD to a biventricular ICD   Subjective/Interval History: Patient states that she is not feeling well.  She complains of chest tightness since last night.  Also complains of some shortness of breath.  There is some pleuritic component to her chest pain.  Denies any dizziness lightheadedness.  No nausea vomiting.    Assessment/Plan:  Malfunction of ventricular ICD/ventricular fibrillation Patient was seen by cardiology/electrophysiology.  Patient underwent upgrade of single-chamber ICD to biventricular ICD on 1/2. Complains of chest pain this morning.  Chest x-ray does not show anything concerning.  She also mentions dyspnea.  She does have a history of CHF as mentioned below. EKG was done overnight.  Cardiology to address these issues this morning.  Troponin level is pending.    Acute on chronic systolic CHF Echocardiogram showed LVEF of 35 to 40% with global hypokinesis.  Indeterminate diastolic parameters.  RV function was normal.  No significant valvular abnormalities  noted. Apparently was noncompliant with her daily furosemide .  Received IV furosemide  at [facility].  Not on any IV diuretics currently. Noted to be on Entresto  and spironolactone . On 3 L of oxygen via nasal cannula with saturations in the mid to late 90s.  Prior to wean off to maintain sats greater than 90%.   Volume status difficult to assess at this time.  Weights to be checked daily.  Strict ins and outs. Monitor electrolytes and renal function. Cardiac cath did not show any evidence for significant CAD. No indication for antiplatelets at this time according to cardiology.  LDL noted to be 104.  Consider statin at discharge. Lasix  x 1 today.  History of COPD Stable.  Continue inhalers.  History of bipolar disorder Stable.  Continue home medications.  Obstructive sleep apnea CPAP  Obesity Estimated body mass index is 47.96 kg/m as calculated from the following:   Height as of this encounter: 5' 8 (1.727 m).   Weight as of this encounter: 143.1 kg.   DVT Prophylaxis: SCDs Code Status: Full code Family Communication: Discussed with patient Disposition Plan: Home when cleared by cardiology   Medications: Scheduled:  allopurinol   300 mg Oral Daily   aspirin  EC  81 mg Oral Daily   budesonide -glycopyrrolate -formoterol   2 puff Inhalation BID   citalopram   20 mg Oral Daily   fluticasone   1 spray Each Nare Daily   ketorolac   15 mg Intravenous Once   pantoprazole   40 mg Oral Daily   sacubitril -valsartan   1 tablet Oral BID   spironolactone   25 mg Oral Daily   zinc  sulfate (50mg  elemental zinc )  220 mg Oral Daily   Continuous:  amiodarone  Stopped (08/15/24 1812)  PRN:acetaminophen  **OR** acetaminophen , calcium  carbonate, HYDROcodone -acetaminophen , ipratropium-albuterol , nicotine  polacrilex, ondansetron  **OR** ondansetron  (ZOFRAN ) IV, polyethylene glycol   Objective:  Vital Signs  Vitals:   08/16/24 0100 08/16/24 0330 08/16/24 0656 08/16/24 0840  BP: 117/60 120/69  127/64   Pulse:    81  Resp: 17 18  20   Temp: 98 F (36.7 C) 98.8 F (37.1 C)  98.6 F (37 C)  TempSrc: Oral Oral  Oral  SpO2:    91%  Weight:   (!) 143.1 kg   Height:        Intake/Output Summary (Last 24 hours) at 08/16/2024 0956 Last data filed at 08/15/2024 2359 Gross per 24 hour  Intake 1113.29 ml  Output --  Net 1113.29 ml   Filed Weights   08/13/24 2101 08/16/24 0656  Weight: (!) 142 kg (!) 143.1 kg    General appearance: Awake alert.  In no distress Resp: Clear to auscultation bilaterally.  Normal effort Cardio: S1-S2 is normal regular.  No S3-S4.  No rubs murmurs or bruit GI: Abdomen is soft.  Nontender nondistended.  Bowel sounds are present normal.  No masses organomegaly Extremities: Edema bilateral lower extremities though stable according to patient. No focal neurological deficits.  Lab Results:  Data Reviewed: I have personally reviewed following labs and reports of the imaging studies  CBC: Recent Labs  Lab 08/12/24 1116 08/12/24 1649 08/12/24 1654 08/13/24 0252 08/14/24 0625 08/15/24 0347  WBC 7.1  --   --  7.7 9.3 8.2  HGB 13.8 13.6 13.9 13.2 14.4 13.7  HCT 41.1 40.0 41.0 39.6 43.2 40.9  MCV 90.5  --   --  90.8 92.7 92.3  PLT 224  --   --  215 230 193    Basic Metabolic Panel: Recent Labs  Lab 08/12/24 1116 08/12/24 1649 08/12/24 1654 08/13/24 0252 08/14/24 0625 08/15/24 0347 08/16/24 0551  NA 138   < > 140 139 139 136 135  K 4.1   < > 3.7 3.5 4.4 4.0 4.2  CL 103  --   --  104 104 102 101  CO2 22  --   --  22 22 23 23   GLUCOSE 111*  --   --  99 115* 94 104*  BUN 21  --   --  20 23 23 19   CREATININE 0.99  --   --  0.83 1.06* 0.87 0.93  CALCIUM  10.1  --   --  9.5 9.3 9.2 9.0  MG 2.1  --   --   --   --  1.9 2.1   < > = values in this interval not displayed.    GFR: Estimated Creatinine Clearance: 82.5 mL/min (by C-G formula based on SCr of 0.93 mg/dL).  Liver Function Tests: Recent Labs  Lab 08/13/24 0252  AST 17  ALT 16  ALKPHOS  104  BILITOT 0.4  PROT 6.9  ALBUMIN 3.9    Coagulation Profile: Recent Labs  Lab 08/12/24 1116  INR 1.1    BNP (last 3 results) Recent Labs    08/12/24 1116  PROBNP 1,068.0*    CBG: Recent Labs  Lab 08/12/24 1825 08/13/24 0747  GLUCAP 86 111*    Recent Results (from the past 240 hours)  MRSA Next Gen by PCR, Nasal     Status: None   Collection Time: 08/12/24  6:24 PM   Specimen: Nasal Mucosa; Nasal Swab  Result Value Ref Range Status   MRSA by PCR Next Gen NOT DETECTED NOT DETECTED Final  Comment: (NOTE) The GeneXpert MRSA Assay (FDA approved for NASAL specimens only), is one component of a comprehensive MRSA colonization surveillance program. It is not intended to diagnose MRSA infection nor to guide or monitor treatment for MRSA infections. Test performance is not FDA approved in patients less than 27 years old. Performed at [facility], [address], [place], [place] [address]   Surgical PCR screen     Status: None   Collection Time: 08/14/24  8:00 PM   Specimen: Nasal Mucosa; Nasal Swab  Result Value Ref Range Status   MRSA, PCR NEGATIVE NEGATIVE Final   Staphylococcus aureus NEGATIVE NEGATIVE Final    Comment: (NOTE) The Xpert SA Assay (FDA approved for NASAL specimens in patients 68 years of age and older), is one component of a comprehensive surveillance program. It is not intended to diagnose infection nor to guide or monitor treatment. Performed at [facility], [address] [place], [place] [address]       Radiology Studies: DG Chest 2 View Result Date: 08/16/2024 EXAM: 2 VIEW(S) XRAY OF THE CHEST 08/16/2024 05:28:00 AM COMPARISON: 08/12/2024 CLINICAL HISTORY: ICD (implantable cardioverter-defibrillator) in place FINDINGS: LINES, TUBES AND DEVICES: Left chest AICD with leads projecting over expected area of right atrium and ventricle. LUNGS AND PLEURA: Low lung volumes. Left basilar homogeneous opacity, likely atelectasis.  No pleural effusion. No pneumothorax. HEART AND MEDIASTINUM: Unchanged cardiomegaly. Atherosclerotic calcifications of aortic arch. BONES AND SOFT TISSUES: No acute osseous abnormality. IMPRESSION: 1. Left chest AICD with leads projecting over the expected area of the right atrium and ventricle. 2. Left basilar homogeneous opacity, likely atelectasis. Electronically signed by: Norman Gatlin MD 08/16/2024 08:57 AM EST RP Workstation: [id number]   EP PPM/ICD IMPLANT Result Date: 08/15/2024 Conclusion: Successful upgrade of a single-chamber ICD to a biventricular ICD in a patient with complete heart block, ventricular fibrillation, and a nonischemic cardiomyopathy. Danelle Birmingham, MD        LOS: 3 days   Joette Pebbles  [facility] Pager on [email or web address]  08/16/2024, 9:56 AM   "

## 2024-08-16 NOTE — Progress Notes (Signed)
 OT Cancellation Note  Patient Details Name: Penny Munoz MRN: [id number] DOB: 10-03-1950   Cancelled Treatment:    Reason Eval/Treat Not Completed: Other (comment) (MD hold, pt reporting chest pain and double vision. Will continue efforts as able.)   Karnisha Lefebre M. Bur[place], OTR/L [facility] [phone] Secure Chat Preferred  Aniken Monestime 08/16/2024, 8:51 AM

## 2024-08-16 NOTE — Progress Notes (Signed)
"  °  Progress Note  Patient Name: Mikaiya Wyche Keitt Date of Encounter: 08/16/2024 [facility] Cardiologist: Deatrice Cage, MD   Interval Summary   Feeling quite poorly after CRT-D upgrade.  Having significant chest discomfort.  In review of pacing thresholds on her left bundle lead, the lead is not capturing.  She is also having pain when she lies flat as well as pain when she takes a deep breath.  Vital Signs Vitals:   08/16/24 0100 08/16/24 0330 08/16/24 0656 08/16/24 0840  BP: 117/60 120/69  127/64  Pulse:    81  Resp: 17 18  20   Temp: 98 F (36.7 C) 98.8 F (37.1 C)  98.6 F (37 C)  TempSrc: Oral Oral  Oral  SpO2:    91%  Weight:   (!) 143.1 kg   Height:        Intake/Output Summary (Last 24 hours) at 08/16/2024 0859 Last data filed at 08/15/2024 2359 Gross per 24 hour  Intake 1113.29 ml  Output --  Net 1113.29 ml      08/16/2024    6:56 AM 08/13/2024    9:01 PM 08/13/2024    5:00 AM  Last 3 Weights  Weight (lbs) 315 lb 6.4 oz 313 lb 316 lb 5.8 oz  Weight (kg) 143.065 kg 141.976 kg 143.5 kg      Telemetry/ECG  AV paced, intermittent ventricular ectopy- Personally Reviewed  Physical Exam  GEN: No acute distress.   Neck: No JVD Cardiac: RRR, no murmurs, rubs, or gallops.  Respiratory: Clear to auscultation bilaterally. GI: Soft, nontender, non-distended  MS: No edema  Assessment & Plan   1.  Complete heart block: Continues despite carvedilol .  She had device upgrade to the CRT D yesterday with left bundle lead.  Left bundle lead is no longer capturing.  She is also having pleuritic type chest pain.  She Taressa Rauh need lead revision.  Locklyn Henriquez try and get a team to plan for lead revision today.  If not, Daryn Hicks need to be on Monday.  2.  Ventricular fibrillation: Occurred in the setting of bradycardia.  This should improve with AV synchrony.  3.  Chronic systolic heart failure: No obvious volume overload.  For questions or updates, please contact [facility] Please consult [email or web address] for contact info under         Signed, Sonjia Wilcoxson Gladis Norton, MD   "

## 2024-08-16 NOTE — Interval H&P Note (Signed)
 History and Physical Interval Note:  08/16/2024 11:13 AM  Penny Munoz  has presented today for surgery, with the diagnosis of pacemake lead dislodgement.  The various methods of treatment have been discussed with the patient and family. After consideration of risks, benefits and other options for treatment, the patient has consented to  Procedures: LEAD REVISION/REPAIR (N/A) as a surgical intervention.  The patient's history has been reviewed, patient examined, no change in status, stable for surgery.  I have reviewed the patient's chart and labs.  Questions were answered to the patient's satisfaction.     Bellamy Judson [employer]

## 2024-08-16 NOTE — H&P (View-Only) (Signed)
"  °  Progress Note  Patient Name: Penny Munoz Date of Encounter: 08/16/2024 [facility] Cardiologist: Deatrice Cage, MD   Interval Summary   Feeling quite poorly after CRT-D upgrade.  Having significant chest discomfort.  In review of pacing thresholds on her left bundle lead, the lead is not capturing.  She is also having pain when she lies flat as well as pain when she takes a deep breath.  Vital Signs Vitals:   08/16/24 0100 08/16/24 0330 08/16/24 0656 08/16/24 0840  BP: 117/60 120/69  127/64  Pulse:    81  Resp: 17 18  20   Temp: 98 F (36.7 C) 98.8 F (37.1 C)  98.6 F (37 C)  TempSrc: Oral Oral  Oral  SpO2:    91%  Weight:   (!) 143.1 kg   Height:        Intake/Output Summary (Last 24 hours) at 08/16/2024 0859 Last data filed at 08/15/2024 2359 Gross per 24 hour  Intake 1113.29 ml  Output --  Net 1113.29 ml      08/16/2024    6:56 AM 08/13/2024    9:01 PM 08/13/2024    5:00 AM  Last 3 Weights  Weight (lbs) 315 lb 6.4 oz 313 lb 316 lb 5.8 oz  Weight (kg) 143.065 kg 141.976 kg 143.5 kg      Telemetry/ECG  AV paced, intermittent ventricular ectopy- Personally Reviewed  Physical Exam  GEN: No acute distress.   Neck: No JVD Cardiac: RRR, no murmurs, rubs, or gallops.  Respiratory: Clear to auscultation bilaterally. GI: Soft, nontender, non-distended  MS: No edema  Assessment & Plan   1.  Complete heart block: Continues despite carvedilol .  She had device upgrade to the CRT D yesterday with left bundle lead.  Left bundle lead is no longer capturing.  She is also having pleuritic type chest pain.  She Taressa Rauh need lead revision.  Locklyn Henriquez try and get a team to plan for lead revision today.  If not, Daryn Hicks need to be on Monday.  2.  Ventricular fibrillation: Occurred in the setting of bradycardia.  This should improve with AV synchrony.  3.  Chronic systolic heart failure: No obvious volume overload.  For questions or updates, please contact [facility] Please consult [email or web address] for contact info under         Signed, Sonjia Wilcoxson Gladis Norton, MD   "

## 2024-08-17 ENCOUNTER — Inpatient Hospital Stay (HOSPITAL_COMMUNITY)

## 2024-08-17 ENCOUNTER — Other Ambulatory Visit (HOSPITAL_COMMUNITY): Payer: Self-pay

## 2024-08-17 ENCOUNTER — Encounter (HOSPITAL_COMMUNITY): Payer: Self-pay | Admitting: Internal Medicine

## 2024-08-17 DIAGNOSIS — T82198A Other mechanical complication of other cardiac electronic device, initial encounter: Secondary | ICD-10-CM | POA: Diagnosis not present

## 2024-08-17 LAB — COMPREHENSIVE METABOLIC PANEL WITH GFR
ALT: 10 U/L (ref 0–44)
AST: 24 U/L (ref 15–41)
Albumin: 3.8 g/dL (ref 3.5–5.0)
Alkaline Phosphatase: 112 U/L (ref 38–126)
Anion gap: 11 (ref 5–15)
BUN: 23 mg/dL (ref 8–23)
CO2: 23 mmol/L (ref 22–32)
Calcium: 9.1 mg/dL (ref 8.9–10.3)
Chloride: 98 mmol/L (ref 98–111)
Creatinine, Ser: 1.07 mg/dL — ABNORMAL HIGH (ref 0.44–1.00)
GFR, Estimated: 55 mL/min — ABNORMAL LOW
Glucose, Bld: 97 mg/dL (ref 70–99)
Potassium: 4.2 mmol/L (ref 3.5–5.1)
Sodium: 133 mmol/L — ABNORMAL LOW (ref 135–145)
Total Bilirubin: 0.7 mg/dL (ref 0.0–1.2)
Total Protein: 6.9 g/dL (ref 6.5–8.1)

## 2024-08-17 LAB — CBC
HCT: 35.4 % — ABNORMAL LOW (ref 36.0–46.0)
Hemoglobin: 12 g/dL (ref 12.0–15.0)
MCH: 30.8 pg (ref 26.0–34.0)
MCHC: 33.9 g/dL (ref 30.0–36.0)
MCV: 91 fL (ref 80.0–100.0)
Platelets: 148 K/uL — ABNORMAL LOW (ref 150–400)
RBC: 3.89 MIL/uL (ref 3.87–5.11)
RDW: 14.4 % (ref 11.5–15.5)
WBC: 9.9 K/uL (ref 4.0–10.5)
nRBC: 0 % (ref 0.0–0.2)

## 2024-08-17 LAB — MAGNESIUM: Magnesium: 2.1 mg/dL (ref 1.7–2.4)

## 2024-08-17 MED ORDER — HYDROCODONE-ACETAMINOPHEN 5-325 MG PO TABS
1.0000 | ORAL_TABLET | Freq: Four times a day (QID) | ORAL | 0 refills | Status: DC | PRN
  Filled 2024-08-17: qty 20, 5d supply, fill #0

## 2024-08-17 NOTE — TOC Transition Note (Addendum)
 Transition of Care [facility]) - Discharge Note   Patient Details  Name: Penny Munoz MRN: [id number] Date of Birth: October 19, 1950  Transition of Care [facility]) CM/SW Contact:  Robynn Eileen Hoose, RN Phone Number: 08/17/2024, 11:40 AM   Clinical Narrative:   Patient is being discharged today. Order for [facility] PT noted, referral sent to [facility] and [facility] through EPIC portal. Awaiting response.  1230: [facility] accepted patient from [facility]. Contact information placed on AVS.      Barriers to Discharge: Continued Medical Work up   Patient Goals and CMS Choice Patient states their goals for this hospitalization and ongoing recovery are:: Patient feels deconditioned and wants to feel stronger.          Discharge Placement                       Discharge Plan and Services Additional resources added to the After Visit Summary for   In-house Referral: NA Discharge Planning Services: CM Consult Post Acute Care Choice: Home Health            DME Agency: NA                  Social Drivers of Health (SDOH) Interventions SDOH Screenings   Food Insecurity: No Food Insecurity (08/13/2024)  Housing: Low Risk (08/13/2024)  Transportation Needs: No Transportation Needs (08/13/2024)  Utilities: Not At Risk (08/13/2024)  Social Connections: Moderately Integrated (08/13/2024)  Tobacco Use: Medium Risk (08/16/2024)     Readmission Risk Interventions     No data to display

## 2024-08-17 NOTE — Discharge Summary (Signed)
 " [facility]  Physician Discharge Summary   Patient ID: Penny Munoz MRN: [id number] DOB/AGE: September 27, 1950 74 y.o.  Admit date: 08/13/2024 Discharge date: 08/17/2024    PCP: Buren Rock HERO, MD  DISCHARGE DIAGNOSES:  Principal Problem:   Malfunction of implantable defibrillator ventricular (ICD) lead Active Problems:   VF (ventricular fibrillation) (HCC)   Bipolar disorder (HCC)   Chronic obstructive pulmonary disease (HCC)   Obstructive sleep apnea   Acute on chronic HFrEF (heart failure with reduced ejection fraction) (HCC)   RECOMMENDATIONS FOR OUTPATIENT FOLLOW UP: Cardiology to arrange outpatient follow-up Consider statin at follow-up   Home Health: PT Equipment/Devices: None  CODE STATUS: Full code  DISCHARGE CONDITION: fair  Diet recommendation: As before  INITIAL HISTORY: 74 y.o. female with medical history significant for systolic CHF, COPD, ventricular fibrillation, OSA on CPAP, bipolar disorder, asthma. Patient was admitted to [facility] 12/30 to 12/31.  Patient went to her EP clinic for acute visit -ICD device alert.  Device interrogation - complete heart block with escape in the mid 30s, requiring high RV pacing,was in V-fib earlier with ICD shock x 1, there was also concern for RV lead failure.[name]  She was subsequently referred to the ED at [facility] and subsequently admitted.  She underwent right and left cardiac cath-12/30, without angiographically significant CAD.  Started on amiodarone  drip , and transferred to [facility] for device upgrade versus extraction/reimplantation.   Consultants: Cardiology   Procedures: upgrade of a single-chamber ICD to a biventricular ICD   HOSPITAL COURSE:   Malfunction of ventricular ICD/ventricular fibrillation Patient was seen by cardiology/electrophysiology.  Patient underwent upgrade of single-chamber ICD to biventricular ICD on 1/2.  Underwent another lead revision of RA and left bundle lead on 1/3.  Patient  feels much better this morning.  Cleared by cardiology for discharge.    Acute on chronic systolic CHF Echocardiogram showed LVEF of 35 to 40% with global hypokinesis.  Indeterminate diastolic parameters.  RV function was normal.  No significant valvular abnormalities noted. Apparently was noncompliant with her daily furosemide .  Received IV furosemide  at [facility].   Cardiac cath did not show any evidence for significant CAD. No indication for antiplatelets at this time according to cardiology.  LDL noted to be 104.  Consider statin at follow-up. Stable from a volume standpoint. Weaned off of oxygen.   History of COPD Stable.  Continue inhalers.   History of bipolar disorder Stable.  Continue home medications.   Obstructive sleep apnea CPAP   Obesity Estimated body mass index is 47.96 kg/m as calculated from the following:   Height as of this encounter: 5' 8 (1.727 m).   Weight as of this encounter: 143.1 kg.    Patient is stable.  Okay for discharge home today.   PERTINENT LABS:  The results of significant diagnostics from this hospitalization (including imaging, microbiology, ancillary and laboratory) are listed below for reference.    Microbiology: Recent Results (from the past 240 hours)  MRSA Next Gen by PCR, Nasal     Status: None   Collection Time: 08/12/24  6:24 PM   Specimen: Nasal Mucosa; Nasal Swab  Result Value Ref Range Status   MRSA by PCR Next Gen NOT DETECTED NOT DETECTED Final    Comment: (NOTE) The GeneXpert MRSA Assay (FDA approved for NASAL specimens only), is one component of a comprehensive MRSA colonization surveillance program. It is not intended to diagnose MRSA infection nor to guide or monitor treatment for MRSA infections. Test performance  is not FDA approved in patients less than 18 years old. Performed at [facility], [address] [place], [place] [address]   Surgical PCR screen     Status: None   Collection Time: 08/14/24   8:00 PM   Specimen: Nasal Mucosa; Nasal Swab  Result Value Ref Range Status   MRSA, PCR NEGATIVE NEGATIVE Final   Staphylococcus aureus NEGATIVE NEGATIVE Final    Comment: (NOTE) The Xpert SA Assay (FDA approved for NASAL specimens in patients 59 years of age and older), is one component of a comprehensive surveillance program. It is not intended to diagnose infection nor to guide or monitor treatment. Performed at [facility], [address], [place], [place] [address]      Labs:   Basic Metabolic Panel: Recent Labs  Lab 08/12/24 1116 08/12/24 1649 08/13/24 0252 08/14/24 0625 08/15/24 0347 08/16/24 0551 08/17/24 0354  NA 138   < > 139 139 136 135 133*  K 4.1   < > 3.5 4.4 4.0 4.2 4.2  CL 103  --  104 104 102 101 98  CO2 22  --  22 22 23 23 23   GLUCOSE 111*  --  99 115* 94 104* 97  BUN 21  --  20 23 23 19 23   CREATININE 0.99  --  0.83 1.06* 0.87 0.93 1.07*  CALCIUM  10.1  --  9.5 9.3 9.2 9.0 9.1  MG 2.1  --   --   --  1.9 2.1 2.1   < > = values in this interval not displayed.   Liver Function Tests: Recent Labs  Lab 08/13/24 0252 08/17/24 0354  AST 17 24  ALT 16 10  ALKPHOS 104 112  BILITOT 0.4 0.7  PROT 6.9 6.9  ALBUMIN 3.9 3.8    CBC: Recent Labs  Lab 08/12/24 1116 08/12/24 1649 08/12/24 1654 08/13/24 0252 08/14/24 0625 08/15/24 0347 08/17/24 0354  WBC 7.1  --   --  7.7 9.3 8.2 9.9  HGB 13.8   < > 13.9 13.2 14.4 13.7 12.0  HCT 41.1   < > 41.0 39.6 43.2 40.9 35.4*  MCV 90.5  --   --  90.8 92.7 92.3 91.0  PLT 224  --   --  215 230 193 148*   < > = values in this interval not displayed.    ProBNP (last 3 results) Recent Labs    08/12/24 1116  PROBNP 1,068.0*    CBG: Recent Labs  Lab 08/12/24 1825 08/13/24 0747  GLUCAP 86 111*     IMAGING STUDIES DG Chest 2 View Result Date: 08/17/2024 EXAM: 2 VIEW(S) XRAY OF THE CHEST 08/17/2024 05:22:00 AM COMPARISON: 08/16/2024 CLINICAL HISTORY: Cardiac device in situ, other [id number]  FINDINGS: LINES, TUBES AND DEVICES: Left chest ICD stable in place. LUNGS AND PLEURA: Mild pulmonary edema. Left lung base atelectasis or opacity. Possible small left pleural effusion. No pneumothorax. HEART AND MEDIASTINUM: Stable cardiomegaly. BONES AND SOFT TISSUES: No acute osseous abnormality. IMPRESSION: 1. Left chest ICD stable in position. 2. Mild pulmonary edema. 3. Persistent opacification within the left lung base with air bronchograms noted in the retrocardiac region. 4. Possible small left pleural effusion. Electronically signed by: Waddell Calk MD 08/17/2024 08:21 AM EST RP Workstation: [id number]   EP PPM/ICD IMPLANT Result Date: 08/16/2024 SURGEON: Soyla Norton, MD PREPROCEDURE DIAGNOSES: 1. Nonischemic cardiomyopathy. 2. [place]  Heart Association class III, heart failure chronically. 3. Left bundle-branch block. POSTPROCEDURE DIAGNOSES: 1. Nonischemic cardiomyopathy. 2. [place]   Heart Association class III heart failure chronically. 3. Left bundle-branch block. PROCEDURES: 1. Left upper extremity venography 2. Biventricular ICD implantation. INTRODUCTION:  Penny Munoz is a 74 y.o. female with a nonischemic CM NYHA Class III CHF, and LBBB QRS morophology.  She had BiV ICD upgrade yesterday with micro dislodgment of her right atrial lead and dislodgment of her left bundle lead.  She presents today for lead revision. DESCRIPTION OF PROCEDURE: Informed written consent was obtained and the patient was brought to the electrophysiology lab in the fasting state. The patient was adequately sedated with intravenous Versed , and fentanyl  as outlined in the nursing report. The patient's left chest was prepped and draped in the usual sterile fashion by the EP lab staff. The skin overlying the left deltopectoral region was infiltrated with lidocaine  for local analgesia. A 5-cm incision was made over the left deltopectoral region. A left subcutaneous defibrillator pocket was fashioned using a  combination of sharp and blunt dissection. Electrocautery was used to assure hemostasis. RA/RV Lead Placement: The left axillary vein was cannulated with fluoroscopic visualization. No contrast was required for this endeavor. Through the left axillary vein,  a Medtronic SelectSure [id number] (serial number [id number]) right ventricular pacemaker lead were advanced with fluoroscopic visualization into the left bundle area.  Multiple left bundle sheath were used including [employer], [employer], Medtronic sheath.  Eventually, the Medtronic steerable sheath was used.. The right ventricular lead R-wave measured 7.2 mV with impedance of 739 ohms and a threshold of 1.2 volts at 0.5 milliseconds. The right atrial lead was dissected away from the pectoralis fascia.  The setscrew was retracted and a stylette was placed through the lead.  The lead was repositioned.  The lead was a Medtronic [id number] (serial number [id number]).  Initial P waves measured 3.6 mV, impedance 568 ohms, threshold 0.7 V at 0.5 ms. LV Lead Placement: All three leads were secured to the pectoralis fascia using #2 silk suture over the suture sleeves. The pocket then irrigated with copious gentamicin  solution. The leads were then connected to a Medtronic cobalt XT (serial Number [id number]) device.  A Medtronic Tyrex pouch was placed in the pocket.  The defibrillator was placed into the pocket. The pocket was then closed in 3 layers with 2.0 Vicryl suture for the subcutaneous and 3.0 Vicryl suture subcuticular layers. Steri-Strips and a sterile dressing were then applied. CONCLUSIONS: 1. Nonischemic cardiomyopathy with Left bundle-branch block and chronic [place]  Heart Association class III heart failure. 2. Successful biventricular ICD RA and left bundle lead revision 3. No early apparent complications.   DG Chest 2 View Result Date: 08/16/2024 EXAM: 2 VIEW(S) XRAY OF THE CHEST 08/16/2024 05:28:00 AM COMPARISON: 08/12/2024 CLINICAL HISTORY: ICD  (implantable cardioverter-defibrillator) in place FINDINGS: LINES, TUBES AND DEVICES: Left chest AICD with leads projecting over expected area of right atrium and ventricle. LUNGS AND PLEURA: Low lung volumes. Left basilar homogeneous opacity, likely atelectasis. No pleural effusion. No pneumothorax. HEART AND MEDIASTINUM: Unchanged cardiomegaly. Atherosclerotic calcifications of aortic arch. BONES AND SOFT TISSUES: No acute osseous abnormality. IMPRESSION: 1. Left chest AICD with leads projecting over the expected area of the right atrium and ventricle. 2. Left basilar homogeneous opacity, likely atelectasis. Electronically signed by: Norman Gatlin MD 08/16/2024 08:57 AM EST RP Workstation: [id number]   EP PPM/ICD IMPLANT Result Date: 08/15/2024 Conclusion: Successful upgrade of a single-chamber ICD to a biventricular ICD in a patient with complete heart block, ventricular fibrillation, and a nonischemic cardiomyopathy. Danelle Birmingham, MD   ECHOCARDIOGRAM COMPLETE  Result Date: 08/13/2024    ECHOCARDIOGRAM REPORT   Patient Name:   Penny Munoz Date of Exam: 08/13/2024 Medical Rec #:  [id number]             Height:       68.0 in Accession #:    [id number]            Weight:       316.4 lb Date of Birth:  03-12-1951             BSA:          2.484 m Patient Age:    73 years              BP:           118/79 mmHg Patient Gender: F                     HR:           70 bpm. Exam Location:  [facility] Procedure: 2D Echo, Cardiac Doppler and Color Doppler (Both Spectral and Color            Flow Doppler were utilized during procedure). Indications:     Congestive heart failure I50.9                  Malfunction of implantable defibrillator ventricular ICD lead.  History:         Patient has prior history of Echocardiogram examinations, most                  recent 04/25/2023. CHF, COPD; Risk Factors:Hypertension.  Sonographer:     Christopher Furnace Referring Phys:  [phone] CHRISTOPHER END Diagnosing Phys: Evalene Lunger MD   Sonographer Comments: Technically challenging study due to limited acoustic windows, no subcostal window and no apical window. IMPRESSIONS  1. Left ventricular ejection fraction, by estimation, is 35 to 40%. The left ventricle has moderately decreased function. The left ventricle demonstrates global hypokinesis. The left ventricular internal cavity size was moderately dilated. Left ventricular diastolic parameters are indeterminate.  2. Right ventricular systolic function is normal. The right ventricular size is normal. There is normal pulmonary artery systolic pressure. The estimated right ventricular systolic pressure is 18.8 mmHg.  3. The mitral valve is normal in structure. No evidence of mitral valve regurgitation. No evidence of mitral stenosis.  4. The aortic valve is normal in structure. Aortic valve regurgitation is not visualized. No aortic stenosis is present.  5. The inferior vena cava is normal in size with greater than 50% respiratory variability, suggesting right atrial pressure of 3 mmHg. FINDINGS  Left Ventricle: Left ventricular ejection fraction, by estimation, is 35 to 40%. The left ventricle has moderately decreased function. The left ventricle demonstrates global hypokinesis. Strain was performed and the global longitudinal strain is indeterminate. The left ventricular internal cavity size was moderately dilated. There is no left ventricular hypertrophy. Left ventricular diastolic parameters are indeterminate. Right Ventricle: The right ventricular size is normal. No increase in right ventricular wall thickness. Right ventricular systolic function is normal. There is normal pulmonary artery systolic pressure. The tricuspid regurgitant velocity is 1.86 m/s, and  with an assumed right atrial pressure of 5 mmHg, the estimated right ventricular systolic pressure is 18.8 mmHg. Left Atrium: Left atrial size was normal in size. Right Atrium: Right atrial size was normal in size. Pericardium: There is  no evidence of pericardial effusion. Mitral Valve: The mitral valve is normal in structure. No  evidence of mitral valve regurgitation. No evidence of mitral valve stenosis. Tricuspid Valve: The tricuspid valve is normal in structure. Tricuspid valve regurgitation is not demonstrated. No evidence of tricuspid stenosis. Aortic Valve: The aortic valve is normal in structure. Aortic valve regurgitation is not visualized. No aortic stenosis is present. Pulmonic Valve: The pulmonic valve was normal in structure. Pulmonic valve regurgitation is not visualized. No evidence of pulmonic stenosis. Aorta: The aortic root is normal in size and structure. Venous: The inferior vena cava is normal in size with greater than 50% respiratory variability, suggesting right atrial pressure of 3 mmHg. IAS/Shunts: No atrial level shunt detected by color flow Doppler. Additional Comments: 3D was performed not requiring image post processing on an independent workstation and was indeterminate. A device lead is visualized.  LEFT VENTRICLE PLAX 2D LVIDd:         4.50 cm LVIDs:         3.50 cm LV PW:         1.60 cm LV IVS:        1.50 cm LVOT diam:     2.00 cm LVOT Area:     3.14 cm  LEFT ATRIUM         Index LA diam:    3.80 cm 1.53 cm/m   AORTA Ao Root diam: 2.70 cm TRICUSPID VALVE TR Peak grad:   13.8 mmHg TR Vmax:        186.00 cm/s  SHUNTS Systemic Diam: 2.00 cm Evalene Lunger MD Electronically signed by Evalene Lunger MD Signature Date/Time: 08/13/2024/1:47:07 PM    Final    CARDIAC CATHETERIZATION Result Date: 08/12/2024   There is moderate to severe left ventricular systolic dysfunction.   LV end diastolic pressure is mildly elevated.   There is no aortic valve stenosis.   Anticipated discharge date to be determined.   No indication for antiplatelet therapy at this time . Conclusions: No angiographically significant coronary artery disease. Moderately to severely reduced left ventricular systolic function (evaluation limited by  suboptimal opacification of the left ventricle). Mildly elevated left heart filling pressures (PCWP 19, LVEDP 20 mmHg). Moderately elevated right heart filling pressures (mean RA/RV EDP 12 mmHg). Mild pulmonary hypertension (PA 36/19, mean 25 mmHg). Normal Fick cardiac output/index (CO 6.3 L/min, CI 2.6 L/min/m). Recommendations: Maintain net even to slightly negative fluid balance. Continue goal-directed medical therapy for nonischemic cardiomyopathy. Further management of VF and concern for ICD RV lead dysfunction per electrophysiology. Lonni Hanson, MD [facility]  DG Chest 2 View Result Date: 08/12/2024 CLINICAL DATA:  Chest pain.  Shortness of breath. EXAM: CHEST - 2 VIEW COMPARISON:  May 30, 2017. FINDINGS: Stable cardiomegaly. Left-sided defibrillator is unchanged. Both lungs are clear. The visualized skeletal structures are unremarkable. IMPRESSION: No active cardiopulmonary disease. Electronically Signed   By: Lynwood Landy Raddle M.D.   On: 08/12/2024 13:20   CUP PACEART INCLINIC DEVICE CHECK Result Date: 08/12/2024 Abnormal in-clinic _single__ chamber ICD check. Presenting Rhythm: _VP__ . Routine testing was performed. Threshold stable. RV integrity warning for elevated impedence, high rate NS episodes. VF episode 12/30, appears appropriate. HV therapy x 1 with clean break to VS/VP. See OV Note for further discussion. Estimated longevity _19 months___ . Pt is not enrolled in remote follow-up. CANDIE Needle, NP   DISCHARGE EXAMINATION: Vitals:   08/17/24 0022 08/17/24 0300 08/17/24 0400 08/17/24 0853  BP: 115/60  113/73 111/68  Pulse: 81   84  Resp: 18  20 20   Temp: 99 F (37.2  C)  98.8 F (37.1 C) (P) 99.3 F (37.4 C)  TempSrc: Oral  Oral Oral  SpO2: 90%  92% 90%  Weight:  (!) 152 kg    Height:       General appearance: Awake alert.  In no distress Resp: Clear to auscultation bilaterally.  Normal effort Cardio: S1-S2 is normal regular.  No S3-S4.  No rubs murmurs or  bruit GI: Abdomen is soft.  Nontender nondistended.  Bowel sounds are present normal.  No masses organomegaly   DISPOSITION: Home  Discharge Instructions     Call MD for:  difficulty breathing, headache or visual disturbances   Complete by: As directed    Call MD for:  extreme fatigue   Complete by: As directed    Call MD for:  persistant dizziness or light-headedness   Complete by: As directed    Call MD for:  persistant nausea and vomiting   Complete by: As directed    Call MD for:  severe uncontrolled pain   Complete by: As directed    Call MD for:  temperature >100.4   Complete by: As directed    Discharge instructions   Complete by: As directed    Please follow instructions provided by the cardiologist.  You were cared for by a hospitalist during your hospital stay. If you have any questions about your discharge medications or the care you received while you were in the hospital after you are discharged, you can call the unit and asked to speak with the hospitalist on call if the hospitalist that took care of you is not available. Once you are discharged, your primary care physician will handle any further medical issues. Please note that NO REFILLS for any discharge medications will be authorized once you are discharged, as it is imperative that you return to your primary care physician (or establish a relationship with a primary care physician if you do not have one) for your aftercare needs so that they can reassess your need for medications and monitor your lab values. If you do not have a primary care physician, you can call [phone] for a physician referral.   Discharge wound care:   Complete by: As directed    As instructed by cardiology.   Increase activity slowly   Complete by: As directed          Allergies as of 08/17/2024       Reactions   Oxycodone Hcl Shortness Of Breath   Ampicillin    Other reaction(s): Angioedema Other reaction(s): Angioedema    Amoxicillin Rash   Penicillins Anxiety        Medication List     PAUSE taking these medications    carvedilol  12.5 MG tablet Wait to take this until your doctor or other care provider tells you to start again. Commonly known as: COREG  Take 1 tablet (12.5 mg total) by mouth 2 (two) times daily with a meal.       STOP taking these medications    Voltaren 1 % Gel Generic drug: diclofenac Sodium       TAKE these medications    albuterol  108 (90 Base) MCG/ACT inhaler Commonly known as: VENTOLIN  HFA Inhale into the lungs as needed.   allopurinol  300 MG tablet Commonly known as: ZYLOPRIM  Take 300 mg by mouth daily.   cetirizine 10 MG tablet Commonly known as: ZYRTEC Take 10 mg by mouth daily.   citalopram  20 MG tablet Commonly known as: CELEXA  Take 20 mg by  mouth daily.   fluticasone  50 MCG/ACT nasal spray Commonly known as: FLONASE  Place 1 spray into both nostrils daily.   furosemide  40 MG tablet Commonly known as: LASIX  Take 1 tablet (40 mg total) by mouth daily.   GARLIC PO Take 1 tablet by mouth daily.   hydrALAZINE  20 MG/ML injection Commonly known as: APRESOLINE  Inject 0.5 mLs (10 mg total) into the vein every 20 (twenty) minutes as needed (high blood pressure).   HYDROcodone -acetaminophen  5-325 MG tablet Commonly known as: NORCO/VICODIN Take 1 tablet by mouth every 6 (six) hours as needed for severe pain (pain score 7-10).   multivitamin with minerals tablet Take 1 tablet by mouth daily.   omeprazole 20 MG capsule Commonly known as: PRILOSEC Take 20 mg by mouth daily.   sacubitril -valsartan  24-26 MG Commonly known as: ENTRESTO  TAKE 1 TABLET TWICE A DAY   spironolactone  25 MG tablet Commonly known as: ALDACTONE  Take 1 tablet (25 mg total) by mouth daily.   Trelegy Ellipta 100-62.5-25 MCG/ACT Aepb Generic drug: Fluticasone -Umeclidin-Vilant Inhale 1 Inhalation into the lungs daily.               Discharge Care Instructions   (From admission, onward)           Start     Ordered   08/17/24 0000  Discharge wound care:       Comments: As instructed by cardiology.   08/17/24 9071              Contact information for follow-up providers     Buren Rock HERO, MD Follow up.   Specialty: Family Medicine Contact information: [address] [place] [address] [phone]              Contact information for after-discharge care     Home Medical Care     [facility]) .   Service: Home Health Services Contact information: [address] [place]  [address] [phone]                     TOTAL DISCHARGE TIME: 35 minutes  Colum Colt Verdene  [facility] Pager on [email or web address]  08/18/2024, 11:57 AM    "

## 2024-08-17 NOTE — Progress Notes (Signed)
 OT Cancellation Note  Patient Details Name: Penny Munoz MRN: [id number] DOB: [birth year]   Cancelled Treatment:    Reason Eval/Treat Not Completed: OT screened, no needs identified, will sign off. Pt and family are aware of L UE restriction and daughter is available to assist pt at home.   Kennth Mliss Helling 08/17/2024, 11:17 AM Mliss HERO, OTR/L Acute Rehabilitation Services Office: [phone]

## 2024-08-17 NOTE — TOC Progression Note (Signed)
 Transition of Care [facility]) - Progression Note    Patient Details  Name: Penny Munoz MRN: [id number] Date of Birth: 04/13/1951  [facility] of [facility] CM/SW Contact  [address] [place], [place] Phone Number: 08/17/2024, 11:28 AM  Clinical Narrative:    CSW met with patient at bedside. Patient confirmed need for personal care to assist with ADL's. Private duty care list provided.      Chasty Randal, LCSW Transition of Care      Expected Discharge Plan: Home w Home Health Services Barriers to Discharge: Continued Medical Work up               Expected Discharge Plan and Services In-house Referral: NA Discharge Planning Services: CM Consult Post Acute Care Choice: Home Health Living arrangements for the past 2 months: Single Family Home Expected Discharge Date: 08/17/24                 DME Agency: NA                   Social Drivers of Health (SDOH) Interventions SDOH Screenings   Food Insecurity: No Food Insecurity (08/13/2024)  Housing: Low Risk (08/13/2024)  Transportation Needs: No Transportation Needs (08/13/2024)  Utilities: Not At Risk (08/13/2024)  Social Connections: Moderately Integrated (08/13/2024)  Tobacco Use: Medium Risk (08/16/2024)    Readmission Risk Interventions     No data to display

## 2024-08-17 NOTE — Plan of Care (Signed)
  Problem: Education: Goal: Knowledge of General Education information will improve Description: Including pain rating scale, medication(s)/side effects and non-pharmacologic comfort measures Outcome: Adequate for Discharge   Problem: Health Behavior/Discharge Planning: Goal: Ability to manage health-related needs will improve Outcome: Adequate for Discharge   Problem: Clinical Measurements: Goal: Ability to maintain clinical measurements within normal limits will improve Outcome: Adequate for Discharge Goal: Will remain free from infection Outcome: Adequate for Discharge Goal: Diagnostic test results will improve Outcome: Adequate for Discharge Goal: Respiratory complications will improve Outcome: Adequate for Discharge Goal: Cardiovascular complication will be avoided Outcome: Adequate for Discharge   Problem: Activity: Goal: Risk for activity intolerance will decrease Outcome: Adequate for Discharge   Problem: Nutrition: Goal: Adequate nutrition will be maintained Outcome: Adequate for Discharge   Problem: Coping: Goal: Level of anxiety will decrease Outcome: Adequate for Discharge   Problem: Elimination: Goal: Will not experience complications related to bowel motility Outcome: Adequate for Discharge Goal: Will not experience complications related to urinary retention Outcome: Adequate for Discharge   Problem: Pain Managment: Goal: General experience of comfort will improve and/or be controlled Outcome: Adequate for Discharge   Problem: Safety: Goal: Ability to remain free from injury will improve Outcome: Adequate for Discharge   Problem: Skin Integrity: Goal: Risk for impaired skin integrity will decrease Outcome: Adequate for Discharge   Problem: Education: Goal: Knowledge of cardiac device and self-care will improve Outcome: Adequate for Discharge Goal: Ability to safely manage health related needs after discharge will improve Outcome: Adequate for  Discharge Goal: Individualized Educational Video(s) Outcome: Adequate for Discharge   Problem: Cardiac: Goal: Ability to achieve and maintain adequate cardiopulmonary perfusion will improve Outcome: Adequate for Discharge

## 2024-08-17 NOTE — Evaluation (Addendum)
 Physical Therapy Evaluation and Discharge Patient Details Name: Penny Munoz MRN: [id number] DOB: 1950-12-21 Today's Date: 08/17/2024  History of Present Illness  74 y/o F admitted for ICD alerted to [facility] with 08/12/24 R and L cardiac cath then transferred to [facility] for device upgrade versus extraction/reimplantation. 1/2 upgrade pacemaker; 1/3 Left bundle lead no longer capturing and went for lead revision  PMHx: systolic CHF, COPD, ventricular fibrillation, OSA on CPAP, bipolar disorder, asthma.  Clinical Impression   Patient evaluated by Physical Therapy with no further acute PT needs identified. All education has been completed and the patient has no further questions. RN stated pt will be provided with handout re: LUE restrictions when RN goes over discharge paperwork. Precautions reviewed with pt and daughter multiple times (pt highly distracted trying to get dressed to leave) with pt able to verbalize precautions by end of session. Patient ambulated in room with her cane in RUE without imbalance (+dyspnea). Patient easily fatigued with activity, required assist with bed mobility, and with dressing. Patient could benefit from HHPT and HH aide. PT is signing off as pt is to discharge home today (per MD, RN, and pt). Thank you for this referral.         If plan is discharge home, recommend the following: A little help with bathing/dressing/bathroom;Assistance with cooking/housework   Can travel by private vehicle        Equipment Recommendations None recommended by PT  Recommendations for Other Services  Other (comment) [facility] aide for patient)    Functional Status Assessment Patient has had a recent decline in their functional status and demonstrates the ability to make significant improvements in function in a reasonable and predictable amount of time.     Precautions / Restrictions Precautions Precautions: Fall;ICD/Pacemaker Recall of Precautions/Restrictions:  Impaired Precaution/Restrictions Comments: Reveiwed precautions for LUE multiple times with pt able to state precautions by end of session; RN to provide correct handout at time of discharge (per RN) Required Braces or Orthoses: Sling Restrictions Weight Bearing Restrictions Per Provider Order: No      Mobility  Bed Mobility Overal bed mobility: Needs Assistance Bed Mobility: Supine to Sit     Supine to sit: Min assist, HOB elevated     General bed mobility comments: pt reports has a bed that is adjustable; daughter provided min assist to pt so she could pull herself to sit EOB (with RUE)    Transfers Overall transfer level: Needs assistance Equipment used: Straight cane Transfers: Sit to/from Stand Sit to Stand: Contact guard assist           General transfer comment: wide BOS; slow to rise; denied dizziness    Ambulation/Gait Ambulation/Gait assistance: Contact guard assist Gait Distance (Feet): 10 Feet Assistive device: Straight cane Gait Pattern/deviations: Step-to pattern, Decreased stride length       General Gait Details: easily becomes dyspneic; stops for standing rest after 5 ft (pt also fatigued from just getting dressed)  Stairs            Wheelchair Mobility     Tilt Bed    Modified Rankin (Stroke Patients Only)       Balance Overall balance assessment: Modified Independent                                           Pertinent Vitals/Pain Pain Assessment Pain Assessment: Faces Faces Pain Scale: Hurts  even more Pain Location: chest/incision Pain Descriptors / Indicators: Discomfort, Grimacing, Guarding, Operative site guarding Pain Intervention(s): Limited activity within patient's tolerance, Monitored during session, Repositioned    Home Living Family/patient expects to be discharged to:: Private residence Living Arrangements: Spouse/significant other;Other relatives Available Help at Discharge: Family;Available  24 hours/day Type of Home: House Home Access: Ramped entrance       Home Layout: One level Home Equipment: Cane - single point      Prior Function Prior Level of Function : Independent/Modified Independent             Mobility Comments: pt uses cane in her RUE at all times; 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)       Extremity/Trunk Assessment   Upper Extremity Assessment Upper Extremity Assessment: LUE deficits/detail LUE Deficits / Details: in sling; doffed for getting dressed as pt preparing to leave; donned afterwards;    Lower Extremity Assessment Lower Extremity Assessment: Generalized weakness    Cervical / Trunk Assessment Cervical / Trunk Assessment: Other exceptions Cervical / Trunk Exceptions: overweight  Communication   Communication Communication: No apparent difficulties    Cognition Arousal: Alert Behavior During Therapy: Anxious, Impulsive                             Following commands: Intact       Cueing Cueing Techniques: Verbal cues, Visual cues     General Comments General comments (skin integrity, edema, etc.): Daughter present. Educated pt on dressing LUE first and not to raise higher than shoulder height.    Exercises     Assessment/Plan    PT Assessment Patient does not need any further PT services  PT Problem List         PT Treatment Interventions      PT Goals (Current goals can be found in the Care Plan section)  Acute Rehab PT Goals Patient Stated Goal: go home soon PT Goal Formulation: All assessment and education complete, DC therapy    Frequency       Co-evaluation               AM-PAC PT 6 Clicks Mobility  Outcome Measure Help needed turning from your back to your side while in a flat bed without using bedrails?: A Lot Help needed moving from lying on your back to sitting on the side of a flat bed without using bedrails?: A Lot Help needed moving to and from a bed to a  chair (including a wheelchair)?: A Little Help needed standing up from a chair using your arms (e.g., wheelchair or bedside chair)?: A Little Help needed to walk in hospital room?: A Little Help needed climbing 3-5 steps with a railing? : A Lot 6 Click Score: 15    End of Session Equipment Utilized During Treatment: Other (comment) (sling) Activity Tolerance: Patient limited by fatigue Patient left: in bed;with call bell/phone within reach;with family/visitor present Nurse Communication: Mobility status;Other (comment) (wants to speak to SW re: HH aide) PT Visit Diagnosis: Difficulty in walking, not elsewhere classified (R26.2)    Time: 8975-8942 PT Time Calculation (min) (ACUTE ONLY): 33 min   Charges:   PT Evaluation $PT Eval Low Complexity: 1 Low PT Treatments $Self Care/Home Management: 8-22 PT General Charges $$ ACUTE PT VISIT: 1 Visit          Macario RAMAN, PT Acute Rehabilitation Services  Office [phone]    Macario  P Quaron Delacruz 08/17/2024, 11:16 AM

## 2024-08-17 NOTE — Progress Notes (Signed)
"  °  Progress Note  Patient Name: Penny Munoz Date of Encounter: 08/17/2024 [facility] Cardiologist: Deatrice Cage, MD   Interval Summary   Post left bundle and RA lead revision yesterday.  Patient feeling well today.  No acute complaints.  Vital Signs Vitals:   08/16/24 1618 08/17/24 0022 08/17/24 0300 08/17/24 0400  BP: (!) 99/56 115/60  113/73  Pulse: 66 81    Resp: 17 18  20   Temp: 99 F (37.2 C) 99 F (37.2 C)  98.8 F (37.1 C)  TempSrc: Oral Oral  Oral  SpO2: 94% 90%  92%  Weight:   (!) 152 kg   Height:        Intake/Output Summary (Last 24 hours) at 08/17/2024 0807 Last data filed at 08/17/2024 0023 Gross per 24 hour  Intake 390 ml  Output 695 ml  Net -305 ml      08/17/2024    3:00 AM 08/16/2024    6:56 AM 08/13/2024    9:01 PM  Last 3 Weights  Weight (lbs) 335 lb 1.6 oz 315 lb 6.4 oz 313 lb  Weight (kg) 152 kg 143.065 kg 141.976 kg      Telemetry/ECG  AV paced- Personally Reviewed  Physical Exam  GEN: No acute distress.   Neck: No JVD Cardiac: RRR, no murmurs, rubs, or gallops.  Respiratory: Clear to auscultation bilaterally. GI: Soft, nontender, non-distended  MS: No edema  Assessment & Plan   1.  Complete heart block: Had device upgrade on Friday with lead revision of RA and left bundle lead yesterday.  Patient is feeling well.  Device is functioning appropriately.  Darcy Cordner have the patient work with PT OT, and Shiana Rappleye likely plan for discharge after that.  2.  Ventricular fibrillation: Occurred in the setting of bradycardia.  Should improve with AV synchrony.  3.  Chronic systolic heart failure: No obvious volume overload.  [facility] Shonica Weier sign off.   The patient is ready for discharge today from a cardiac standpoint. Medication Recommendations: None new Other recommendations (labs, testing, etc): None Follow up as an outpatient: Steward Sames be arranged in device clinic For questions or updates, please contact [facility] Please consult [email or web address] for contact info under         Signed, Leeana Creer Gladis Norton, MD   "

## 2024-08-18 ENCOUNTER — Ambulatory Visit: Payer: Self-pay | Admitting: Internal Medicine

## 2024-08-18 MED FILL — Midazolam HCl Inj 2 MG/2ML (Base Equivalent): INTRAMUSCULAR | Qty: 5 | Status: AC

## 2024-08-18 MED FILL — Midazolam HCl Inj 2 MG/2ML (Base Equivalent): INTRAMUSCULAR | Qty: 2 | Status: AC

## 2024-08-21 ENCOUNTER — Telehealth: Payer: Self-pay

## 2024-08-21 NOTE — Telephone Encounter (Signed)
 Spoke with pt to get her serial number on her monitor. Pt gave the serial number and it seems to be attached to another pt in carelink. I have reached out to [employer] ( [employer] rep) who will assist me with this

## 2024-08-26 ENCOUNTER — Ambulatory Visit
Admission: RE | Admit: 2024-08-26 | Discharge: 2024-08-26 | Disposition: A | Source: Ambulatory Visit | Attending: Cardiology

## 2024-08-26 ENCOUNTER — Ambulatory Visit
Admission: RE | Admit: 2024-08-26 | Discharge: 2024-08-26 | Disposition: A | Discharge status: Home / Self Care | Attending: Cardiology | Admitting: Cardiology

## 2024-08-26 ENCOUNTER — Encounter: Payer: Self-pay | Admitting: Cardiology

## 2024-08-26 ENCOUNTER — Ambulatory Visit: Admitting: Cardiology

## 2024-08-26 VITALS — BP 138/82 | HR 90 | Wt 314.6 lb

## 2024-08-26 DIAGNOSIS — Z95810 Presence of automatic (implantable) cardiac defibrillator: Secondary | ICD-10-CM | POA: Diagnosis not present

## 2024-08-26 DIAGNOSIS — Z950 Presence of cardiac pacemaker: Secondary | ICD-10-CM

## 2024-08-26 DIAGNOSIS — I442 Atrioventricular block, complete: Secondary | ICD-10-CM | POA: Diagnosis not present

## 2024-08-26 DIAGNOSIS — I5022 Chronic systolic (congestive) heart failure: Secondary | ICD-10-CM | POA: Diagnosis not present

## 2024-08-26 LAB — CUP PACEART INCLINIC DEVICE CHECK
Date Time Interrogation Session: 20260113161145
Implantable Lead Connection Status: 753985
Implantable Lead Connection Status: 753985
Implantable Lead Connection Status: 753985
Implantable Lead Implant Date: 20151002
Implantable Lead Implant Date: 20260102
Implantable Lead Implant Date: 20260102
Implantable Lead Location: 753859
Implantable Lead Location: 753860
Implantable Lead Location: 753860
Implantable Lead Model: 3830
Implantable Lead Model: 5076
Implantable Pulse Generator Implant Date: 20260102

## 2024-08-26 NOTE — Progress Notes (Signed)
 "     Electrophysiology Clinic Note    Date:  08/26/2024  Patient ID:  Penny, Munoz 1951-01-22, MRN [id number] PCP:  Buren Rock HERO, MD  Cardiologist:  Deatrice Cage, MD  Electrophysiologist:  Fonda Kitty, MD  Electrophysiology APP:  Loraina Stauffer, NP     Discussed the use of AI scribe software for clinical note transcription with the patient, who gave verbal consent to proceed.   Patient Profile    Chief Complaint: device wound check  History of Present Illness: Penny Munoz is a 74 y.o. female with PMH notable for HFrEF, NICM s/p ICD, non-obs CAD, HTN, OSA on CPAP, COPD; seen today for Fonda Kitty, MD (previously Dr. Cindie) for acute visit due to ICD concern.    She last saw Dr. Cindie 12/2023, at which time she was agreeable to remote monitoring, historically was not agreeable.   She was seen in clinic late December for device alerting. Found to be in CHB with frequent PVCs, episode of VT earlier that day with HV therapy. She was referred to ED for further workup. L/RHC with non-obs CAD, s/p device upgrade with addition of RA and LV lead. She had significant chest pain post-procedure with LV lead non-capture. She is s/p RA and LV lead revision 1/3 with discharge the following day.   She presents today for her wound check. She c/o pain and tenderness at the L chest pocket, no drainage or redness.  She has SOB, states that she felt exactly like this during the hospitalization. SOB is not worse. No cough or fever. She has not showered, has been following L shoulder activity restrictions.       Arrhythmia/Device History MDT Bi V ICD JP, imp 2015; dx HFrEF    ROS:  Please see the history of present illness. All other systems are reviewed and otherwise negative.    Physical Exam    VS:  BP 138/82 (BP Location: Right Wrist, Patient Position: Sitting, Cuff Size: Normal)   Pulse 90   Wt (!) 314 lb 9.6 oz (142.7 kg)   SpO2 90%   BMI 47.83 kg/m   BMI: Body mass index is 47.83 kg/m.           Wt Readings from Last 3 Encounters:  08/26/24 (!) 314 lb 9.6 oz (142.7 kg)  08/17/24 (!) 335 lb 1.6 oz (152 kg)  08/13/24 (!) 316 lb 5.8 oz (143.5 kg)      GEN- The patient is chronically-ill appearing, alert and oriented x 3 today.   Lungs- diminished throughout, normal work of breathing.  Heart- Regular, bradycardic rate and rhythm, no murmurs, rubs or gallops Extremities- 1+ peripheral edema, warm, dry Skin-  slight fluctuance noted. Steri-strips removed easily, incision is c/d/i   Device interrogation done today and reviewed by myself:  Battery 6.6 years RA, RV lead threshold stable LV lead is not capturing. Adjusted polarity without capture  VP 91% Brief NSVT episode  Set LV lead subthreshold, unable to turn off Adjusted pacing to RV only   Studies Reviewed   Previous EP, cardiology notes.    EKG is not ordered.          TTE, 04/25/2023  1. Left ventricular ejection fraction, by estimation, is 35 to 40%. The left ventricle has moderately decreased function. The left ventricle demonstrates global hypokinesis. The left ventricular internal cavity size was mildly dilated. There is mild left ventricular hypertrophy. Left ventricular diastolic parameters are consistent with Grade I diastolic dysfunction (impaired  relaxation).   2. Right ventricular systolic function is mildly reduced. The right ventricular size is normal.   3. Left atrial size was mildly dilated.   4. The mitral valve is normal in structure. Mild mitral valve regurgitation. No evidence of mitral stenosis.   5. Tricuspid valve regurgitation is mild to moderate.   6. The aortic valve has an indeterminant number of cusps. Aortic valve regurgitation is mild. No aortic stenosis is present.   7. The inferior vena cava is normal in size with greater than 50% respiratory variability, suggesting right atrial pressure of 3 mmHg.    Assessment and Plan     #)  NICM s/p device upgrade #) HFrEF #) CHB S/p device upgrade with addition of RA and LV lead, LV lead is LBBAP.  LV lead today does not capture at highest output.  Discussed with Dr. Kennyth.  CXR today to evaluate lead placement Will plan for LV lead revision Patient requested to go home and coordinate with family before setting procedure date.  Will msg Dr. Shaune nurse to follow-up with patient tomorrow      Current medicines are reviewed at length with the patient today.   The patient has concerns regarding her medicines.  The following changes were made today:  none at this time  Labs/ tests ordered today include:  Orders Placed This Encounter  Procedures   DG Chest 2 View     Disposition: Follow up with Dr. Kennyth or EP APP as usual post procedure   Signed, Chantal Needle, NP  08/26/2024  3:55 PM  Electrophysiology [facility] "

## 2024-08-26 NOTE — Patient Instructions (Signed)
 Medication Instructions:  Your physician recommends that you continue on your current medications as directed. Please refer to the Current Medication list given to you today.  *If you need a refill on your cardiac medications before your next appointment, please call your pharmacy*  Lab Work: No labs ordered today    Testing/Procedures: Your provider has ordered a chest X-Ray for you. You can have this done at the [facility] medical mall. You do not need an appointment. Please go to the entrance of the Medical Mall and check in at the front desk.    Follow-Up: At [facility], you and your health needs are our priority.  As part of our continuing mission to provide you with exceptional heart care, our providers are all part of one team.  This team includes your primary Cardiologist (physician) and Advanced Practice Providers or APPs (Physician Assistants and Nurse Practitioners) who all work together to provide you with the care you need, when you need it.  Your next appointment:   2 week(s)  Provider:   Suzann Riddle, NP

## 2024-08-27 ENCOUNTER — Ambulatory Visit

## 2024-08-27 ENCOUNTER — Telehealth: Payer: Self-pay

## 2024-08-27 ENCOUNTER — Encounter: Payer: Self-pay | Admitting: *Deleted

## 2024-08-27 DIAGNOSIS — I4901 Ventricular fibrillation: Secondary | ICD-10-CM

## 2024-08-27 DIAGNOSIS — Z95810 Presence of automatic (implantable) cardiac defibrillator: Secondary | ICD-10-CM

## 2024-08-27 NOTE — Telephone Encounter (Signed)
 Spoke with the patient to schedule a lead revision with Dr. Kennyth. Will plan for 2/6 at 11:30am. Advised patient that I will notify her if something sooner opens up.

## 2024-08-28 ENCOUNTER — Telehealth: Payer: Self-pay

## 2024-08-28 NOTE — Telephone Encounter (Signed)
-----   Message from Nurse Doreatha BROCKS, RN sent at 08/27/2024  3:12 PM EST ----- Regarding: 09/19/24 ICD lead revision  Precert:  MD: Kennyth Type of implant: CRT-D LV lead revision #with anesthesia# Device manufacturer: Medtronic Diagnosis: lead malfunction CPT code: 66779 C-code(s), including quantity (if indicated):  Procedure scheduled (date/time): 2/6 at 11:30am  Procedure:  Scrub given? No  Medication instructions: No meds morning of Message sent to CVRR? No Added to calendar? Yes Orders entered? Yes Letter complete? Yes Scheduled with cath lab? Yes Labs ordered (CBC, BMET, PT/INR if on warfarin)? Yes Dye allergy? No Pre-meds ordered and instructions given? No, not needed Letter method: Mailed Special instructions: w/ anesthesia H&P: 1/13  Follow-up:  Cassie/Angel, please schedule Routine.  Covering RN:  Please send this message to [employer], EP scheduler, EP Scheduling pool, and EP [employer].

## 2024-08-31 ENCOUNTER — Ambulatory Visit: Payer: Self-pay | Admitting: Cardiology

## 2024-09-02 ENCOUNTER — Ambulatory Visit: Attending: Cardiology | Admitting: Cardiology

## 2024-09-02 NOTE — Progress Notes (Unsigned)
 " Cardiology Office Note   Date:  09/02/2024  ID:  Penny, Munoz 1951/03/13, MRN [id number] PCP: Buren Rock HERO, MD  [facility] Providers Cardiologist:  Deatrice Cage, MD Electrophysiologist:  Fonda Kitty, MD  Electrophysiology APP:  Riddle, Suzann, NP { Click to update primary MD,subspecialty MD or APP then REFRESH:1}    History of Present Illness Penny Munoz is a 74 y.o. female with past medical history of HFrEF, nonischemic cardiomyopathy, s/p ICD (2004), hypertension, retroperitoneal mass, nonobstructive coronary artery disease (2015), CAD follow-up.   ICD placement 2004 at the [facility] .  Last heart catheterization in 12/2013.Echo in 2021 showed EF 35-40% with global hypokinesis, mild to moderate MR, mild aortic regurgitation, and G1DD.   She was seen by Dr. Cage on 03/03/2022.  At that time she continued to have shortness of breath with exertion.  There were no medication changes that was ordered at that time.She also was seen by EP Dr Penny Munoz on 05/17/2022.  Her device was functioning well. There was a long discussion that was had about remote monitoring and the patient had finally agreed. There were no medication changes that were made and no testing that was ordered.   She previously was seen in clinic 03/30/2023 with complaints of shortness of breath and peripheral edema.  She states she continues suffer from fatigue.  States her husband and cancer and had multiple strokes and she was primary caregiver.  She was continued on her current medication regimen without any changes made.  There was no further testing that was ordered at that time either.  She was seen in clinic 12/19/2023 by EP with no changes to her medication and her device was functioning appropriately.  She was seen 01/22/2024 was stable from a cardiac perspective without chest pain or worsening dyspnea.  She continued to be under significant stress due to the health of her  husband.  There were no medication changes that were made and no further ischemic testing that was needed.    ROS: ***  Studies Reviewed      TTE 08/05/2020 1. Left ventricular ejection fraction, by estimation, is 35 to 40%. The  left ventricle has moderately decreased function. The left ventricle  demonstrates global hypokinesis. The left ventricular internal cavity size  was mildly to moderately dilated.  There is mild left ventricular hypertrophy. Left ventricular diastolic  parameters are consistent with Grade I diastolic dysfunction (impaired  relaxation).   2. Right ventricular systolic function is low normal. The right  ventricular size is normal.   3. The mitral valve is grossly normal. Mild to moderate mitral valve  regurgitation.   4. The aortic valve is grossly normal. Aortic valve regurgitation is  mild.   5. The inferior vena cava is normal in size with greater than 50%  respiratory variability, suggesting right atrial pressure of 3 mmHg.   Risk Assessment/Calculations {Does this patient have ATRIAL FIBRILLATION?:[phone]} No BP recorded.  {Refresh Note OR Click here to enter BP  :1}***       Physical Exam VS:  There were no vitals taken for this visit.       Wt Readings from Last 3 Encounters:  08/26/24 (!) 314 lb 9.6 oz (142.7 kg)  08/17/24 (!) 335 lb 1.6 oz (152 kg)  08/13/24 (!) 316 lb 5.8 oz (143.5 kg)    GEN: Well nourished, well developed in no acute distress NECK: No JVD; No carotid bruits CARDIAC: ***RRR, no murmurs, rubs, gallops RESPIRATORY:  Clear to auscultation without rales, wheezing or rhonchi  ABDOMEN: Soft, non-tender, non-distended EXTREMITIES:  No edema; No deformity   ASSESSMENT AND PLAN ***    {Are you ordering a CV Procedure (e.g. stress test, cath, DCCV, TEE, etc)?   Press F2        :[id number]}  Dispo: ***  Signed, Luci Bellucci, NP   "

## 2024-09-03 ENCOUNTER — Telehealth (HOSPITAL_COMMUNITY): Payer: Self-pay

## 2024-09-03 NOTE — Telephone Encounter (Signed)
 Attempted to reach patient to discuss upcoming procedure, no answer. Unable to leave message- VM not set up.

## 2024-09-04 ENCOUNTER — Encounter: Payer: Self-pay | Admitting: Cardiology

## 2024-09-09 ENCOUNTER — Telehealth: Payer: Self-pay | Admitting: Cardiovascular Disease

## 2024-09-09 NOTE — Telephone Encounter (Signed)
 Printed, placed in nurse box

## 2024-09-09 NOTE — Telephone Encounter (Signed)
 Lydia with [facility] is faxing a care plan that needs to be signed by pts physician. Please advise.   Callback [phone]

## 2024-09-18 NOTE — Pre-Procedure Instructions (Signed)
Instructed patient on the following items: Arrival time 0930 Nothing to eat or drink after midnight No meds AM of procedure Responsible person to drive you home and stay with you for 24 hrs Wash with special soap night before and morning of procedure  

## 2024-09-18 NOTE — Anesthesia Preprocedure Evaluation (Signed)
"                                    Anesthesia Evaluation  Patient identified by MRN, date of birth, ID band Patient awake    Reviewed: Allergy & Precautions, NPO status , Patient's Chart, lab work & pertinent test results  History of Anesthesia Complications Negative for: history of anesthetic complications  Airway Mallampati: II  TM Distance: >3 FB Neck ROM: Full    Dental  (+) Edentulous Lower, Edentulous Upper   Pulmonary sleep apnea and Continuous Positive Airway Pressure Ventilation , COPD,  COPD inhaler, former smoker   Pulmonary exam normal        Cardiovascular hypertension, Pt. 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) + pacemaker + Cardiac Defibrillator   TTE 08/13/24: EF 35-40%, global hypokinesis, moderate LVE, valves ok    Neuro/Psych   Anxiety  Bipolar Disorder   negative neurological ROS     GI/Hepatic Neg liver ROS,GERD  Medicated and Controlled,,  Endo/Other    Class 3 obesity  Renal/GU negative Renal ROS     Musculoskeletal negative musculoskeletal ROS (+)    Abdominal   Peds  Hematology negative hematology ROS (+)   Anesthesia Other Findings   Reproductive/Obstetrics                              Anesthesia Physical Anesthesia Plan  ASA: 3  Anesthesia Plan: MAC   Post-op Pain Management: Minimal or no pain anticipated   Induction:   PONV Risk Score and Plan: 2 and Treatment may vary due to age or medical condition, Propofol  infusion and Ondansetron   Airway Management Planned: Natural Airway and Simple Face Mask  Additional Equipment:   Intra-op Plan:   Post-operative Plan:   Informed Consent: I have reviewed the patients History and Physical, chart, labs and discussed the procedure including the risks, benefits and alternatives for the proposed anesthesia with the patient or authorized representative who has indicated his/her understanding and  acceptance.       Plan Discussed with: CRNA  Anesthesia Plan Comments:          Anesthesia Quick Evaluation  "

## 2024-09-19 ENCOUNTER — Encounter (HOSPITAL_COMMUNITY): Payer: Self-pay | Admitting: Certified Registered Nurse Anesthetist

## 2024-09-19 ENCOUNTER — Ambulatory Visit (HOSPITAL_COMMUNITY)
Admission: RE | Admit: 2024-09-19 | Discharge: 2024-09-19 | Disposition: A | Source: Home / Self Care | Attending: Cardiology | Admitting: Cardiology

## 2024-09-19 ENCOUNTER — Encounter: Payer: Self-pay | Admitting: Emergency Medicine

## 2024-09-19 ENCOUNTER — Other Ambulatory Visit: Payer: Self-pay

## 2024-09-19 ENCOUNTER — Ambulatory Visit (HOSPITAL_COMMUNITY)

## 2024-09-19 ENCOUNTER — Encounter (HOSPITAL_COMMUNITY): Admission: RE | Disposition: A | Payer: Self-pay | Source: Home / Self Care | Attending: Cardiology

## 2024-09-19 LAB — BASIC METABOLIC PANEL WITH GFR
Anion gap: 13 (ref 5–15)
BUN: 13 mg/dL (ref 8–23)
CO2: 21 mmol/L — ABNORMAL LOW (ref 22–32)
Calcium: 9.2 mg/dL (ref 8.9–10.3)
Chloride: 103 mmol/L (ref 98–111)
Creatinine, Ser: 0.82 mg/dL (ref 0.44–1.00)
GFR, Estimated: >60 mL/min
Glucose, Bld: 101 mg/dL — ABNORMAL HIGH (ref 70–99)
Potassium: 4 mmol/L (ref 3.5–5.1)
Sodium: 138 mmol/L (ref 135–145)

## 2024-09-19 LAB — CBC
HCT: 39.7 % (ref 36.0–46.0)
Hemoglobin: 13 g/dL (ref 12.0–15.0)
MCH: 30.4 pg (ref 26.0–34.0)
MCHC: 32.7 g/dL (ref 30.0–36.0)
MCV: 93 fL (ref 80.0–100.0)
Platelets: 225 10*3/uL (ref 150–400)
RBC: 4.27 MIL/uL (ref 3.87–5.11)
RDW: 13.9 % (ref 11.5–15.5)
WBC: 7.6 10*3/uL (ref 4.0–10.5)
nRBC: 0 % (ref 0.0–0.2)

## 2024-09-19 MED ORDER — SODIUM CHLORIDE 0.9 % IV SOLN
INTRAVENOUS | Status: AC
  Administered 2024-09-19: 80 mg
  Filled 2024-09-19: qty 2

## 2024-09-19 MED ORDER — SODIUM CHLORIDE 0.9 % IV SOLN: 80.0000 mg | INTRAVENOUS | Status: AC

## 2024-09-19 MED ORDER — DEXMEDETOMIDINE HCL IN NACL 80 MCG/20ML IV SOLN
INTRAVENOUS | Status: DC | PRN
  Administered 2024-09-19: 8 ug via INTRAVENOUS
  Administered 2024-09-19: 4 ug via INTRAVENOUS

## 2024-09-19 MED ORDER — PHENYLEPHRINE 80 MCG/ML (10ML) SYRINGE FOR IV PUSH (FOR BLOOD PRESSURE SUPPORT)
PREFILLED_SYRINGE | INTRAVENOUS | Status: DC | PRN
  Administered 2024-09-19 (×2): 80 ug via INTRAVENOUS
  Administered 2024-09-19 (×2): 160 ug via INTRAVENOUS

## 2024-09-19 MED ORDER — LIDOCAINE HCL 1 % IJ SOLN
INTRAMUSCULAR | Status: AC
  Filled 2024-09-19: qty 60

## 2024-09-19 MED ORDER — CHLORHEXIDINE GLUCONATE 4 % EX SOLN
4.0000 | Freq: Once | CUTANEOUS | Status: DC
  Filled 2024-09-19: qty 60

## 2024-09-19 MED ORDER — FENTANYL CITRATE (PF) 100 MCG/2ML IJ SOLN
INTRAMUSCULAR | Status: AC
  Filled 2024-09-19: qty 2

## 2024-09-19 MED ORDER — PROPOFOL 10 MG/ML IV BOLUS
INTRAVENOUS | Status: DC | PRN
  Administered 2024-09-19: 20 mg via INTRAVENOUS

## 2024-09-19 MED ORDER — ACETAMINOPHEN 500 MG PO TABS
1000.0000 mg | ORAL_TABLET | Freq: Once | ORAL | Status: AC
  Administered 2024-09-19: 1000 mg via ORAL

## 2024-09-19 MED ORDER — FENTANYL CITRATE (PF) 100 MCG/2ML IJ SOLN
25.0000 ug | INTRAMUSCULAR | Status: AC | PRN
  Administered 2024-09-19: 50 ug via INTRAVENOUS

## 2024-09-19 MED ORDER — LIDOCAINE 2% (20 MG/ML) 5 ML SYRINGE
INTRAMUSCULAR | Status: DC | PRN
  Administered 2024-09-19: 40 mg via INTRAVENOUS

## 2024-09-19 MED ORDER — FENTANYL CITRATE (PF) 100 MCG/2ML IJ SOLN
INTRAMUSCULAR | Status: DC | PRN
  Administered 2024-09-19 (×2): 50 ug via INTRAVENOUS

## 2024-09-19 MED ORDER — PROPOFOL 500 MG/50ML IV EMUL
INTRAVENOUS | Status: DC | PRN
  Administered 2024-09-19: 50 ug/kg/min via INTRAVENOUS

## 2024-09-19 MED ORDER — HEPARIN (PORCINE) IN NACL 1000-0.9 UT/500ML-% IV SOLN
INTRAVENOUS | Status: DC | PRN
  Administered 2024-09-19: 500 mL

## 2024-09-19 MED ORDER — ACETAMINOPHEN 500 MG PO TABS
ORAL_TABLET | ORAL | Status: AC
  Filled 2024-09-19: qty 2

## 2024-09-19 MED ORDER — LIDOCAINE HCL (PF) 1 % IJ SOLN
INTRAMUSCULAR | Status: DC | PRN
  Administered 2024-09-19: 30 mL

## 2024-09-19 MED ORDER — IOHEXOL 350 MG/ML SOLN
INTRAVENOUS | Status: DC | PRN
  Administered 2024-09-19: 20 mL

## 2024-09-19 MED ORDER — SODIUM CHLORIDE 0.9 % IV SOLN: INTRAVENOUS | Status: DC

## 2024-09-19 MED ORDER — POVIDONE-IODINE 10 % EX SWAB
2.0000 | Freq: Once | CUTANEOUS | Status: AC
  Administered 2024-09-19: 2 via TOPICAL

## 2024-09-19 MED ORDER — PHENYLEPHRINE HCL-NACL 20-0.9 MG/250ML-% IV SOLN
INTRAVENOUS | Status: DC | PRN
  Administered 2024-09-19: 20 ug/min via INTRAVENOUS

## 2024-09-19 MED ORDER — DEXMEDETOMIDINE HCL IN NACL 80 MCG/20ML IV SOLN
INTRAVENOUS | Status: AC
  Filled 2024-09-19: qty 20

## 2024-09-19 MED ORDER — CEFAZOLIN SODIUM-DEXTROSE 3-4 GM/150ML-% IV SOLN
3.0000 g | INTRAVENOUS | Status: AC
  Administered 2024-09-19: 3 g via INTRAVENOUS
  Filled 2024-09-19: qty 150

## 2024-09-19 MED ORDER — DEXAMETHASONE SOD PHOSPHATE PF 10 MG/ML IJ SOLN
INTRAMUSCULAR | Status: DC | PRN
  Administered 2024-09-19: 5 mg via INTRAVENOUS

## 2024-09-19 MED ORDER — ACETAMINOPHEN 325 MG PO TABS
325.0000 mg | ORAL_TABLET | ORAL | Status: AC | PRN
  Administered 2024-09-19: 650 mg via ORAL
  Filled 2024-09-19: qty 2

## 2024-09-19 MED ORDER — HYDROCODONE-ACETAMINOPHEN 5-325 MG PO TABS: 1.0000 | ORAL_TABLET | Freq: Two times a day (BID) | ORAL | 0 refills | Status: AC | PRN

## 2024-09-19 MED ORDER — ONDANSETRON HCL 4 MG/2ML IJ SOLN: 4.0000 mg | Freq: Four times a day (QID) | INTRAMUSCULAR | Status: AC | PRN

## 2024-09-19 MED ORDER — ONDANSETRON HCL 4 MG/2ML IJ SOLN
INTRAMUSCULAR | Status: DC | PRN
  Administered 2024-09-19: 4 mg via INTRAVENOUS

## 2024-09-19 NOTE — H&P (Signed)
 "   Patient ID: Penny Munoz MRN: [id number]; DOB: Feb 24, 1951  Admit date: 09/19/2024 Date of Consult: 09/19/2024  PCP:  Buren Rock HERO, MD   [facility] Providers Cardiologist:  Deatrice Cage, MD  Electrophysiologist:  Fonda Kitty, MD  Electrophysiology APP:  Riddle, Suzann, NP       HPI: Penny Munoz is a 74 y.o. female with PMH notable for HFrEF, NICM s/p ICD, non-obs CAD, HTN, OSA on CPAP, COPD; seen today for Fonda Kitty, MD (previously Dr. Cindie) who presents today for LV lead revision. Her LBBAP lead has dislodged since implant on 12/31. She reports feeling relatively well. No new or acute complaints.   Past Medical History:  Diagnosis Date   CHF (congestive heart failure) (HCC)    COPD (chronic obstructive pulmonary disease) (HCC)    Coronary artery disease    Hypertension     Past Surgical History:  Procedure Laterality Date   BIV UPGRADE N/A 08/15/2024   Procedure: BIV UPGRADE;  Surgeon: Waddell Danelle ORN, MD;  Location: [facility];  Service: Cardiovascular;  Laterality: N/A;   CARDIAC DEFIBRILLATOR PLACEMENT     LEAD REVISION/REPAIR N/A 08/16/2024   Procedure: LEAD REVISION/REPAIR;  Surgeon: Inocencio Soyla Lunger, MD;  Location: [facility];  Service: Cardiovascular;  Laterality: N/A;   RIGHT/LEFT HEART CATH AND CORONARY ANGIOGRAPHY N/A 08/12/2024   Procedure: RIGHT/LEFT HEART CATH AND CORONARY ANGIOGRAPHY;  Surgeon: Mady Bruckner, MD;  Location: [facility];  Service: Cardiovascular;  Laterality: N/A;     Scheduled Meds:  acetaminophen         ceFAZolin  (ANCEF ) IV  3 g Intravenous On Call   chlorhexidine   4 Application Topical Once   gentamicin  (GARAMYCIN ) 80 mg in sodium chloride  0.9 % 500 mL irrigation  80 mg Irrigation On Call   Continuous Infusions:  sodium chloride      PRN Meds: acetaminophen   Allergies:   Allergies[1]  Social History:   Social History   Socioeconomic History   Marital status:  Married    Spouse name: Not on file   Number of children: Not on file   Years of education: Not on file   Highest education level: Not on file  Occupational History   Not on file  Tobacco Use   Smoking status: Former    Current packs/day: 0.00    Types: Cigarettes    Quit date: 2015    Years since quitting: 11.1   Smokeless tobacco: Never   Tobacco comments:    Started smoking at 74 years old    Smoked 1 PPD at her heaviest    Quit smoking in 2015  Vaping Use   Vaping status: Never Used  Substance and Sexual Activity   Alcohol use: No   Drug use: No   Sexual activity: Not on file  Other Topics Concern   Not on file  Social History Narrative   From home and takes care of husband.   Social Drivers of Health   Tobacco Use: Medium Risk (08/26/2024)   Patient History    Smoking Tobacco Use: Former    Smokeless Tobacco Use: Never    Passive Exposure: Not on file  Financial Resource Strain: Not on file  Food Insecurity: No Food Insecurity (08/13/2024)   Epic    Worried About Programme Researcher, Broadcasting/film/video in the Last Year: Never true    Ran Out of Food in the Last Year: Never true  Transportation Needs: No Transportation Needs (08/13/2024)  Epic    Lack of Transportation (Medical): No    Lack of Transportation (Non-Medical): No  Physical Activity: Not on file  Stress: Not on file  Social Connections: Moderately Integrated (08/13/2024)   Social Connection and Isolation Panel    Frequency of Communication with Friends and Family: More than three times a week    Frequency of Social Gatherings with Friends and Family: More than three times a week    Attends Religious Services: More than 4 times per year    Active Member of [employer] or Organizations: No    Attends Banker Meetings: Never    Marital Status: Married  Catering Manager Violence: Not At Risk (08/13/2024)   Epic    Fear of Current or Ex-Partner: No    Emotionally Abused: No    Physically Abused: No     Sexually Abused: No  Depression (PHQ2-9): Not on file  Alcohol Screen: Not on file  Housing: Low Risk (08/13/2024)   Epic    Unable to Pay for Housing in the Last Year: No    Number of Times Moved in the Last Year: 0    Homeless in the Last Year: No  Utilities: Not At Risk (08/13/2024)   Epic    Threatened with loss of utilities: No  Health Literacy: Not on file    Family History:   Family History  Adopted: Yes     ROS:  Please see the history of present illness.   All other ROS reviewed and negative.     Physical Exam/Data: Vitals:   09/19/24 1030  BP: 104/62  Pulse: 97  Temp: 98.1 F (36.7 C)  SpO2: 90%  Weight: (!) 141.5 kg  Height: 5' 8 (1.727 m)   No intake or output data in the 24 hours ending 09/19/24 1200    09/19/2024   10:30 AM 08/26/2024    2:58 PM 08/17/2024    3:00 AM  Last 3 Weights  Weight (lbs) 312 lb 314 lb 9.6 oz 335 lb 1.6 oz  Weight (kg) 141.522 kg 142.702 kg 152 kg     Body mass index is 47.44 kg/m.   General: Well developed, in no acute distress.  Neck: No JVD.  Cardiac: Normal rate, regular rhythm.  Resp: Normal work of breathing.  Ext: No edema.  Neuro: No gross focal deficits.  Psych: Normal affect.   Laboratory Data: High Sensitivity Troponin:  No results for input(s): TROPONINIHS in the last 720 hours. No results for input(s): TRNPT in the last 720 hours.    Chemistry Recent Labs  Lab 09/19/24 0959  NA 138  K 4.0  CL 103  CO2 21*  GLUCOSE 101*  BUN 13  CREATININE 0.82  CALCIUM  9.2  GFRNONAA >60  ANIONGAP 13    No results for input(s): PROT, ALBUMIN, AST, ALT, ALKPHOS, BILITOT in the last 168 hours. Lipids No results for input(s): CHOL, TRIG, HDL, LABVLDL, LDLCALC, CHOLHDL in the last 168 hours.  Hematology Recent Labs  Lab 09/19/24 0959  WBC 7.6  RBC 4.27  HGB 13.0  HCT 39.7  MCV 93.0  MCH 30.4  MCHC 32.7  RDW 13.9  PLT 225   Thyroid No results for input(s): TSH, FREET4 in  the last 168 hours.  BNPNo results for input(s): BNP, PROBNP in the last 168 hours.  DDimer No results for input(s): DDIMER in the last 168 hours.  Radiology/Studies:  No results found.   Assessment and Plan: #) Chronic systolic heart failure s/p  CRT-D, now with LV lead dislodgement #) RV lead integrity warning #) VF s/p ICD shock #) HFrEF #) CHB  Explained risks, benefits, and alternatives to LV lead revision, including but not limited to bleeding, infection, damage to heart or lungs, heart attack, stroke, or death.  Pt verbalized understanding and agrees to proceed.    Signed, Fonda Kitty, MD  09/19/2024 12:00 PM      [1]  Allergies Allergen Reactions   Oxycodone Hcl Shortness Of Breath   Ampicillin     Other reaction(s): Angioedema    Amoxicillin Rash   Penicillins Anxiety   "

## 2024-09-19 NOTE — Progress Notes (Signed)
 Pt and friend received discharge instructions, teach back performed. Iv removed, no complications. Left chest site is clean dry intact, no signs of bleeding. Pt escorted out via wheelchair to friends vehicle.

## 2024-09-19 NOTE — Transfer of Care (Signed)
 Immediate Anesthesia Transfer of Care Note  Patient: Penny Munoz  Procedure(s) Performed: LEAD REVISION/REPAIR  Patient Location: PACU  Anesthesia Type:MAC  Level of Consciousness: awake, alert , and oriented  Airway & Oxygen Therapy: Patient connected to nasal cannula oxygen  Post-op Assessment: Report given to RN and Post -op Vital signs reviewed and stable  Post vital signs: Reviewed and stable  Last Vitals:  Vitals Value Taken Time  BP 126/94 09/19/24 15:12  Temp    Pulse 86 09/19/24 15:14  Resp 20 09/19/24 15:14  SpO2 93 % 09/19/24 15:14  Vitals shown include unfiled device data.  Last Pain: There were no vitals filed for this visit.       Complications: There were no known notable events for this encounter.

## 2024-09-19 NOTE — Discharge Instructions (Addendum)
 After Your Cardiac Device   You have a [employer] Cardiac Device  ACTIVITY Do not lift your arm above shoulder height for 1 week after your procedure. After 7 days, you may progress as below.  You should remove your sling 24 hours after your procedure, unless otherwise instructed by your provider.     Friday September 26, 2024  Saturday September 27, 2024 Sunday September 28, 2024 Monday September 29, 2024   Do not lift, push, pull, or carry anything over 10 pounds with the affected arm until 6 weeks (Friday October 31, 2024 ) after your procedure.   You may drive AFTER your wound check, unless you have been told otherwise by your provider.   Ask your healthcare provider when you can go back to work   INCISION/Dressing If you are on a blood thinner such as Coumadin, Xarelto, Eliquis, Plavix, or Pradaxa please confirm with your provider when this should be resumed.   If large square, outer bandage is left in place, this can be removed after 24 hours from your procedure. Do not remove steri-strips or glue as below.   If a PRESSURE DRESSING (a bulky dressing that usually goes up over your shoulder) was applied or left in place, please follow instructions given by your provider on when to return to have this removed.   Monitor your cardiac device site for redness, swelling, and drainage. Call the device clinic at [phone] if you experience these symptoms or fever/chills.  If your incision is sealed with Steri-strips or staples, you may shower 7 days after your procedure or when told by your provider. Do not remove the steri-strips or let the shower hit directly on your site. You may wash around your site with soap and water .    If you were discharged in a sling, please do not wear this during the day more than 48 hours after your surgery unless otherwise instructed. This may increase the risk of stiffness and soreness in your shoulder.   Avoid lotions, ointments, or perfumes over your  incision until it is well-healed.  You may use a hot tub or a pool AFTER your wound check appointment if the incision is completely closed.   DEVICE MANAGEMENT You should receive your ID card for your new device in 4-8 weeks. Keep this card with you at all times once received. Consider wearing a medical alert bracelet or necklace.  Please follow manufacture instructions for keeping your device charged carefully. This should be performed every 2 weeks at the very least.   Your cardiac device may be MRI compatible. This will be discussed at your next office visit/wound check.  You should avoid contact with strong electric or magnetic fields.   Do not use amateur (ham) radio equipment or electric (arc) welding torches. MP3 player headphones with magnets should not be used. Some devices are safe to use if held at least 12 inches (30 cm) from your cardiac device. These include power tools, lawn mowers, and speakers. If you are unsure if something is safe to use, ask your health care provider.  When using your cell phone, hold it to the ear that is on the opposite side from the cardiac device. Do not leave your cell phone in a pocket over the cardiac device.  You may safely use electric blankets, heating pads, computers, and microwave ovens.  Call the office right away if: You have chest pain. You feel more short of breath than you have felt before. You feel more  light-headed than you have felt before. Your incision starts to open up.  This information is not intended to replace advice given to you by your health care provider. Make sure you discuss any questions you have with your health care provider.

## 2024-09-19 NOTE — Anesthesia Postprocedure Evaluation (Signed)
"   Anesthesia Post Note  Patient: Penny Munoz  Procedure(s) Performed: LEAD REVISION/REPAIR     Patient location during evaluation: PACU Anesthesia Type: MAC Level of consciousness: awake and alert Pain management: pain level controlled Vital Signs Assessment: post-procedure vital signs reviewed and stable Respiratory status: spontaneous breathing, nonlabored ventilation and respiratory function stable Cardiovascular status: blood pressure returned to baseline Postop Assessment: no apparent nausea or vomiting Anesthetic complications: no   There were no known notable events for this encounter.  Last Vitals:  Vitals:   09/19/24 1530 09/19/24 1535  BP: 123/80 124/88  Pulse: 73 76  Resp: 15 (!) 23  Temp:    SpO2: 92% 91%                 [name]      "

## 2024-09-22 ENCOUNTER — Encounter

## 2024-10-01 ENCOUNTER — Ambulatory Visit

## 2024-11-20 ENCOUNTER — Ambulatory Visit: Admitting: Cardiology

## 2024-12-22 ENCOUNTER — Ambulatory Visit: Admitting: Cardiology

## 2024-12-22 ENCOUNTER — Encounter

## 2025-03-23 ENCOUNTER — Encounter

## 2025-06-22 ENCOUNTER — Encounter
